# Patient Record
Sex: Female | Born: 1980 | Race: Black or African American | Hispanic: No | Marital: Single | Smoking: Former smoker
Health system: Southern US, Community
[De-identification: ages and names within clinical notes are randomized; demographics above are authoritative.]

## PROBLEM LIST (undated history)

## (undated) DIAGNOSIS — Z8759 Personal history of other complications of pregnancy, childbirth and the puerperium: Secondary | ICD-10-CM

## (undated) DIAGNOSIS — D649 Anemia, unspecified: Secondary | ICD-10-CM

## (undated) DIAGNOSIS — J45909 Unspecified asthma, uncomplicated: Secondary | ICD-10-CM

## (undated) DIAGNOSIS — Z8619 Personal history of other infectious and parasitic diseases: Secondary | ICD-10-CM

## (undated) DIAGNOSIS — Z309 Encounter for contraceptive management, unspecified: Secondary | ICD-10-CM

## (undated) DIAGNOSIS — I2699 Other pulmonary embolism without acute cor pulmonale: Secondary | ICD-10-CM

## (undated) HISTORY — PX: COCCYX FRACTURE SURGERY: SHX599

## (undated) HISTORY — DX: Encounter for contraceptive management, unspecified: Z30.9

## (undated) HISTORY — DX: Other pulmonary embolism without acute cor pulmonale: I26.99

## (undated) HISTORY — DX: Personal history of other infectious and parasitic diseases: Z86.19

## (undated) HISTORY — DX: Unspecified asthma, uncomplicated: J45.909

## (undated) HISTORY — DX: Personal history of other complications of pregnancy, childbirth and the puerperium: Z87.59

## (undated) HISTORY — DX: Anemia, unspecified: D64.9

---

## 2002-07-30 ENCOUNTER — Other Ambulatory Visit: Admission: RE | Admit: 2002-07-30 | Discharge: 2002-07-30 | Payer: Self-pay | Admitting: Obstetrics & Gynecology

## 2006-12-24 ENCOUNTER — Inpatient Hospital Stay (HOSPITAL_COMMUNITY): Admission: AD | Admit: 2006-12-24 | Discharge: 2006-12-27 | Payer: Self-pay | Admitting: *Deleted

## 2006-12-31 ENCOUNTER — Ambulatory Visit: Payer: Self-pay | Admitting: Critical Care Medicine

## 2006-12-31 ENCOUNTER — Inpatient Hospital Stay (HOSPITAL_COMMUNITY): Admission: AD | Admit: 2006-12-31 | Discharge: 2007-01-05 | Payer: Self-pay | Admitting: Obstetrics

## 2009-06-04 IMAGING — CR DG CHEST 2V
3 series · 3 of 3 positions shown · non-contrast
Comparison: 01/01/07.

CLINICAL DATA: Fever.   Short of breath.   Pneumonia.  
 CHEST - 2 VIEWS:

[view not recorded (1 of 3)]
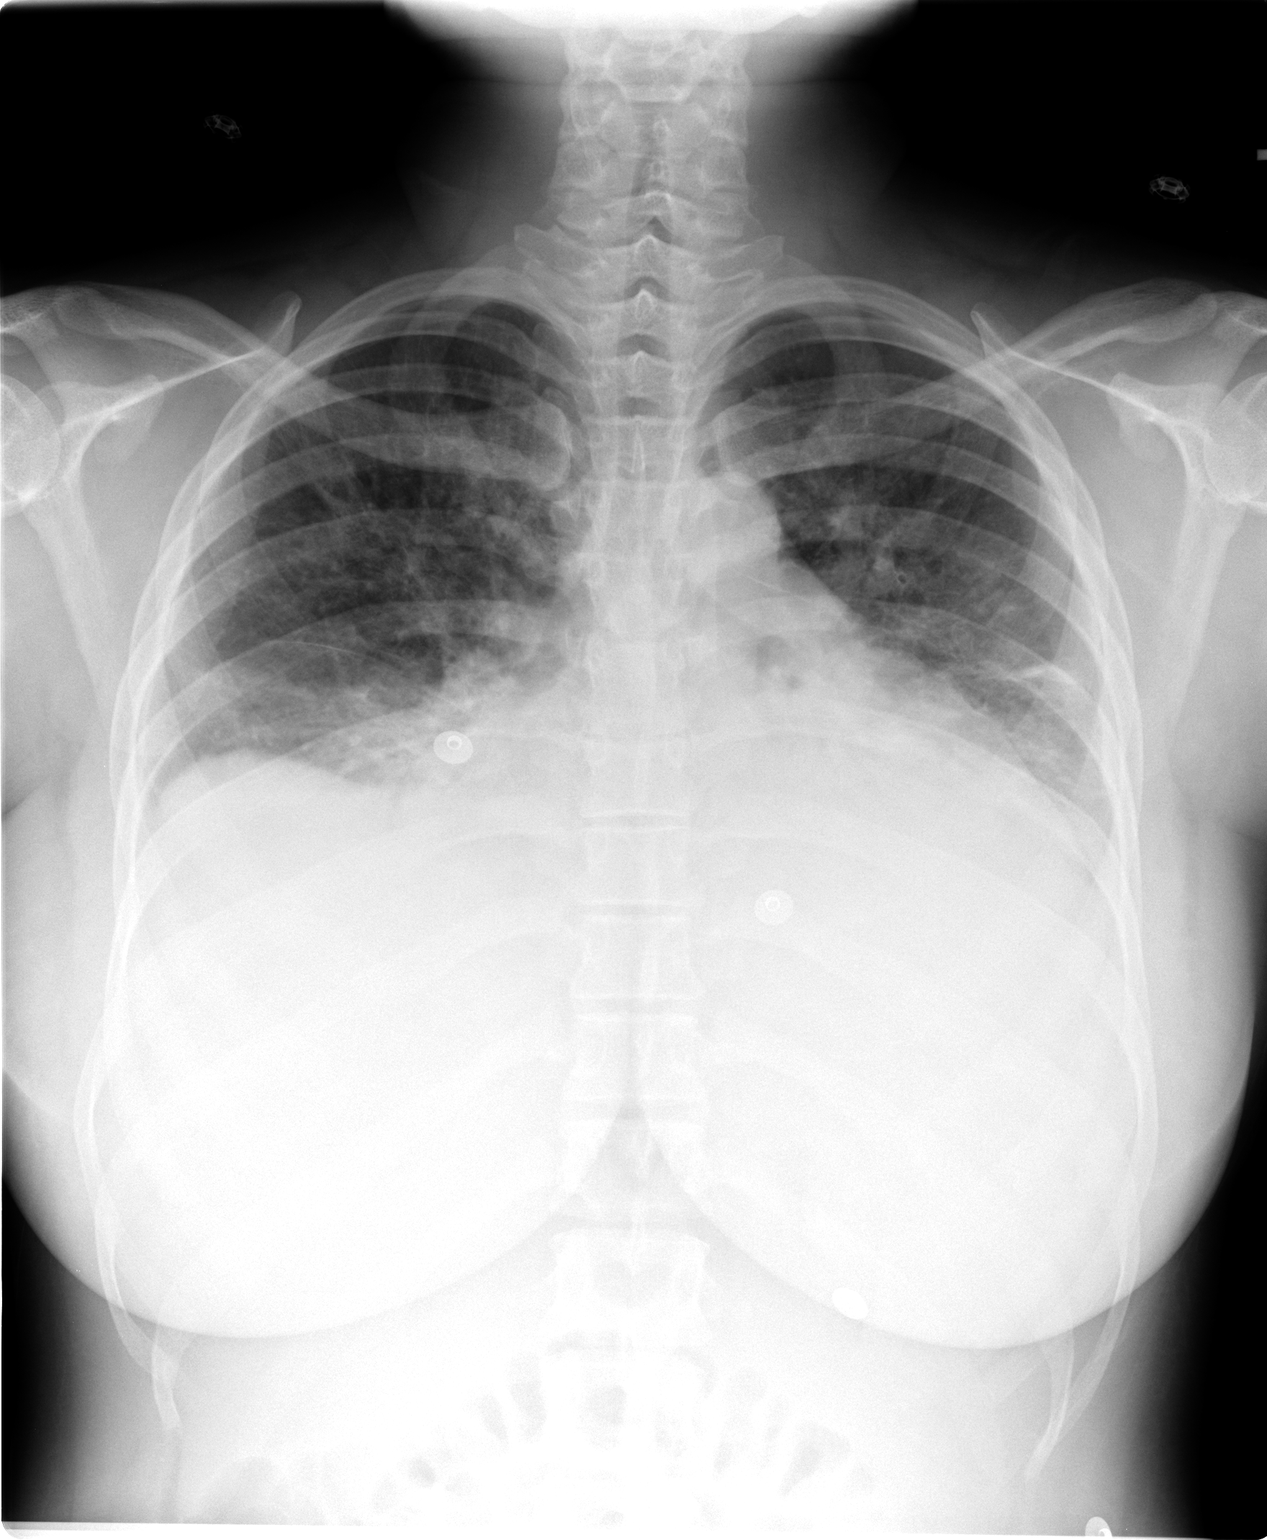

[view not recorded (2 of 3)]
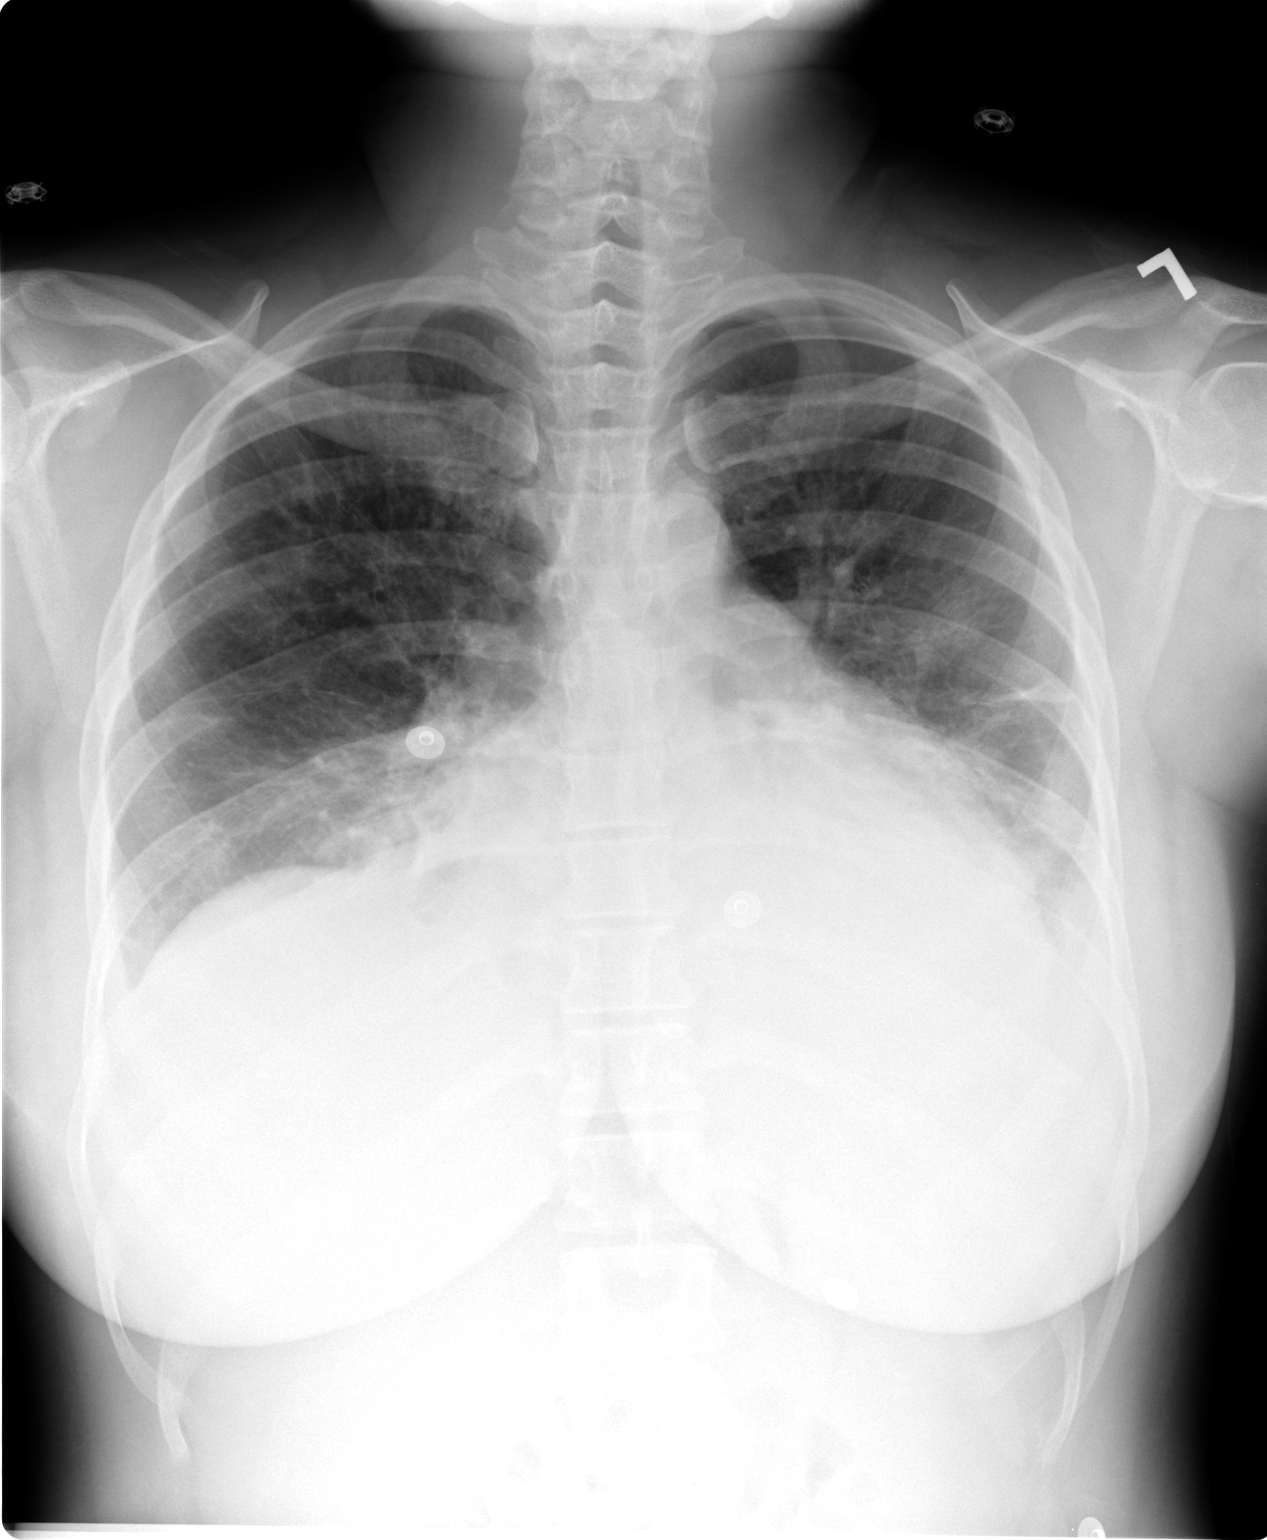

[view not recorded (3 of 3)]
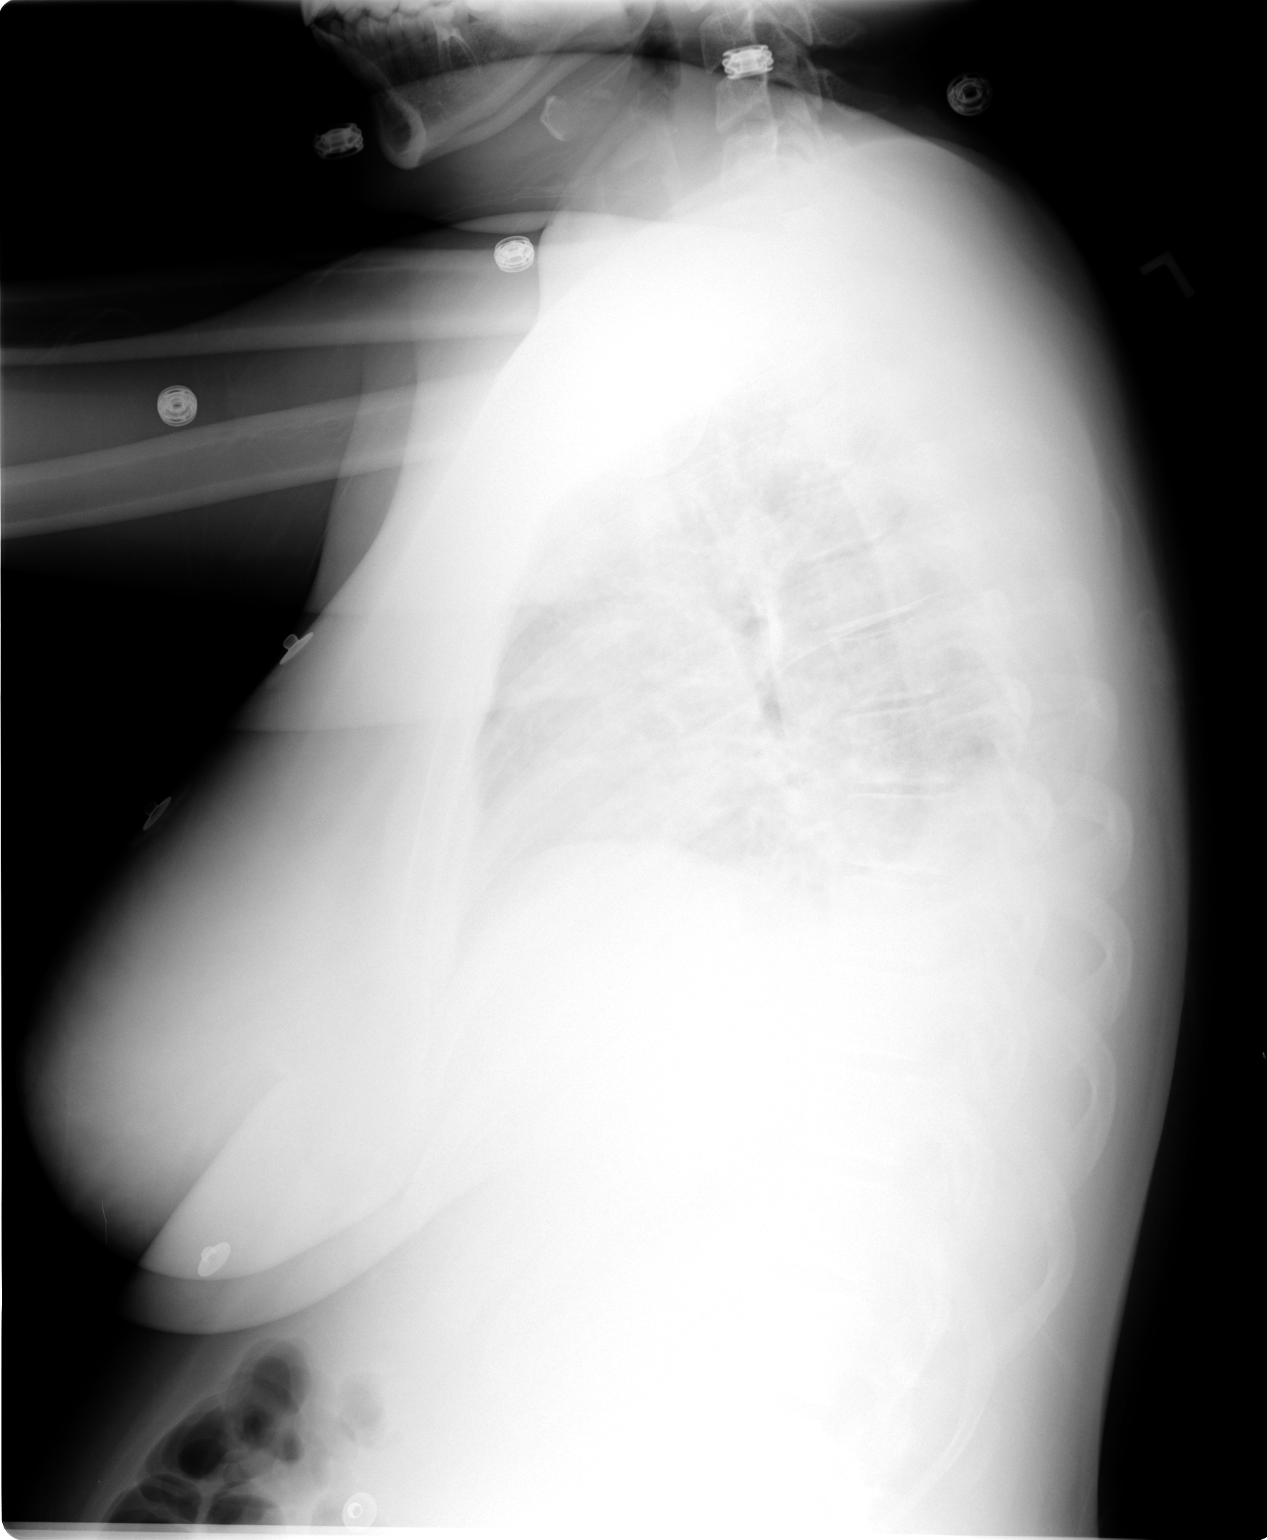

[3 of 3 positions shown; findings below may reference images not displayed]

FINDINGS: The patient is developing pleural effusions.  The cardiac silhouette remains enlarged.  Infiltrates persist in the mid and lower lungs with worsening volume loss at the bases because of the effusions.  No other change.
IMPRESSION: Enlarging effusions.  Persistent infiltrates.  More basilar volume loss because of the effusions.

## 2010-07-03 NOTE — Consult Note (Signed)
NAMEMARVINE, Diana Peterson         ACCOUNT NO.:  [id number]   MEDICAL RECORD NO.:  [id number]          PATIENT TYPE:  INP   LOCATION:  9374                          FACILITY:  [facility]   PHYSICIAN:  Lennie Muckle, MD      DATE OF BIRTH:  1980/10/28   DATE OF CONSULTATION:  01/02/2007  DATE OF DISCHARGE:                                 CONSULTATION   REASON FOR CONSULTATION:  Question of cholecystitis.   HISTORY OF PRESENT ILLNESS:  Diana Peterson is a 30 year old female who  is postpartum since December 25, 2006.  She states on December 30, 2006,  she began having right-sided back pain, as well as white blood cell  count.  She also was noted to have chills, fevers, and a headache, as  well as some mild nausea and vomiting.  She was seen by her gynecologist  and admitted due to a fever of 102 and her shortness of breath.  She  received a chest x-ray, as well as a diagnosis of possible pneumonia.  Was placed on IV antibiotics.  Subsequently, she was found to have  pyelonephritis and a question of cholecystitis based on a CT finding on  CT scan which was performed on December 31, 2006.  There was a lucency  within the gallbladder.  No gallbladder wall thickening or fluid noted.  Followup with an ultrasound showed no evidence of gallstones, but had a  thickened gallbladder wall.  In discussion with the patient today, she  only has some mild right upper quadrant discomfort and aching, but no  severe amount of abdominal pain.  She has no previous episodes of right  upper quadrant pain after eating certain foods and has no history of  problems after eating during her pregnancy.  Her shortness of breath has  become better and her overall fever in the past 24 hours has only been  100.0.  on laboratory data, she has had a mild elevation in her alkaline  phosphatase of 160, previously it was 183.  AST, ALT, and total  bilirubin are normal.   PAST MEDICAL HISTORY:  None.   PAST SURGICAL HISTORY:   She had a pilonidal cyst removed in 2003.   ALLERGIES:  NO DRUG ALLERGIES.   MEDICATIONS:  She takes ibuprofen.   SOCIAL HISTORY:  No drug, tobacco, or alcohol use.   FAMILY HISTORY:  Throat cancer in her maternal uncle.  Diabetes in her  grandfather.  Also history of hypertension and myocardial infarction  within the family.   REVIEW OF SYSTEMS:  Discussed with the patient and upon reviewing the  chart, a 12-point system is negative other than through what is covered  in the HPI.   PHYSICAL EXAMINATION:  GENERAL:  She is a pleasant, young female who  appears her stated age, lying comfortably in bed, in no acute distress.  VITAL SIGNS:  Temperature 100, blood pressure 121/51, pulse 77.  Per the  record, she is on 50% face mask with 97% saturations.  HEENT:  She has some mild scleral erythema on the right.  No scleral  icterus is evident.  CHEST:  Diminished  at the bases on auscultation.  CARDIOVASCULAR:  Regular rate and rhythm.  ABDOMEN:  Protuberant with a minimal amount of pain with palpation in  the right upper quadrant.  No peritoneal signs.  She does have, I  believe, an enlarged uterus, postpartum.   LABORATORY DATA:  On other labs, her white count is 6.9, hemoglobin and  hematocrit 9.2 and 26.1.  BUN and creatinine 5 and 0.8.   ASSESSMENT/PLAN:  Question of acalculous cholecystitis based on  radiographic findings.  Clinically, she only has a mild complaint of  right upper quadrant pain.  Overall, her radiographic findings are  somewhat contradictory.  Clinically, she is not in need for an emergent  cholecystectomy, given her present examination.  She did have  Escherichia coli in her blood cultures.  However, this could be from her  pyelonephritis, despite the fact that her urine culture was negative.  She was on antibiotics prior to obtaining the urine culture.  I think if  she continues to improve on clinical exam, continue with the Cipro for  approximately 10 days  course.  I will plan on seeing her on an  outpatient basis to repeat her ultrasound to see if indeed there is  cholelithiasis.  Her picture is somewhat clouded by the pyelonephritis,  pleural effusions, and question of a pneumonia.  Overall, I think that  clinically she is stable and not critically ill from her gallbladder.  Furthermore, a diagnosis of cholecystitis is not present on clinical  examination. However, if she her symptoms change over the next day or  two, this could indicate a problem with the gallbladder.  But for now, I  think she is stable and in no acute need for a cholecystectomy.  I have  relayed this to Dr. Earlene Plater and we will see [name] again tomorrow  to reevaluate.  This was discussed with her and her significant other  who is present in the room.      Lennie Muckle, MD  Electronically Signed     ALA/MEDQ  D:  01/02/2007  T:  01/03/2007  Job:  [id number]

## 2010-07-06 NOTE — Discharge Summary (Signed)
Diana Peterson, Diana Peterson         ACCOUNT NO.:  [id number]   MEDICAL RECORD NO.:  [id number]          PATIENT TYPE:  INP   LOCATION:  9317                          FACILITY:  [facility]   PHYSICIAN:  Dormont B. Earlene Plater, M.D.  DATE OF BIRTH:  1980/02/27   DATE OF ADMISSION:  12/31/2006  DATE OF DISCHARGE:  01/05/2007                               DISCHARGE SUMMARY   ADMISSION DIAGNOSIS:  Postpartum fever.   DISCHARGE DIAGNOSES:  Pyelonephritis, sepsis, and cholecystitis.   HISTORY OF PRESENT ILLNESS:  A 30 year old African-American female,  approximately 1 week postpartum, presented with fever of 102, shaking  chills, back pain, shortness of breath.   HOSPITAL COURSE:  The patient was admitted to the ICU, admitted to the  third floor GYN, initially.  She was found to have diffuse crackles  posterior aspect of lung fields bilaterally.  Chest x-ray suggestive of  pneumonia.  White count within normal limits.  The patient was started  on empiric treatment for community-acquired pneumonia, when she began to  have desaturations into the 70s to 80s percent range, as well as well a  fever of 102 with associated tachypnea.  She was transferred to the ICU,  critical care consult obtained.  The CT abdomen and pelvis, pain  consistent with pyelonephritis and suggestion of cholecystitis.  The  patient's antibiotics were broadened to cover pyelonephritis, sepsis,  and the patient rapidly improved clinically.  A general surgery  consultation was obtained, and it was the opinion of the surgeon that  the cholecystitis was probably secondary to sepsis and was acalculous.  Plan for oral antibiotics and follow-up as an outpatient.  The patient's  urine culture ultimately did grow E-coli, which was sensitive to  antibiotics prescribed.  She defervesced, did require Lasix to remove  pulmonary interstitial fluid.  Urinary antigens for pneumococcus and  Legionella were negative.  Blood cultures were positive for  E-coli.  By  day of discharge, the patient had been afebrile, stable vital signs,  normal oxygen saturation, feeling much better.  She was discharged home  on 2-week course of Cipro 500 mg p.o. b.i.d., follow up with a week in  the office, and plan was to follow-up with surgery as an outpatient.   DISCHARGE MEDICATIONS:  1. Cipro 500 mg p.o. b.i.d.  2. Zebeta 5 mg b.i.d.   DISCHARGE INSTRUCTIONS:  Call for fever, pain, or worsening symptoms.  Followup one week.      Gerri Spore B. Earlene Plater, M.D.  Electronically Signed     WBD/MEDQ  D:  02/15/2007  T:  02/15/2007  Job:  [id number]

## 2010-07-27 ENCOUNTER — Emergency Department (HOSPITAL_COMMUNITY)
Admission: EM | Admit: 2010-07-27 | Discharge: 2010-07-27 | Disposition: A | Discharge status: Home / Self Care | Payer: Self-pay | Attending: Emergency Medicine | Admitting: Emergency Medicine

## 2010-07-27 DIAGNOSIS — J45909 Unspecified asthma, uncomplicated: Secondary | ICD-10-CM | POA: Insufficient documentation

## 2010-07-27 DIAGNOSIS — L732 Hidradenitis suppurativa: Secondary | ICD-10-CM | POA: Insufficient documentation

## 2010-11-27 LAB — DIFFERENTIAL
Basophils Absolute: 0
Basophils Absolute: 0
Basophils Absolute: 0
Basophils Relative: 0
Basophils Relative: 0
Basophils Relative: 0
Eosinophils Absolute: 0 — ABNORMAL LOW
Eosinophils Relative: 0
Lymphocytes Relative: 10 — ABNORMAL LOW
Lymphocytes Relative: 14
Lymphocytes Relative: 6 — ABNORMAL LOW
Lymphs Abs: 1.1
Monocytes Absolute: 0.8
Monocytes Absolute: 1.1 — ABNORMAL HIGH
Monocytes Relative: 10
Monocytes Relative: 9
Neutro Abs: 5.1
Neutro Abs: 8.2 — ABNORMAL HIGH
Neutro Abs: 8.6 — ABNORMAL HIGH
Neutrophils Relative %: 74
Neutrophils Relative %: 86 — ABNORMAL HIGH

## 2010-11-27 LAB — BLOOD GAS, ARTERIAL
Acid-Base Excess: 0.3
Bicarbonate: 21.9
Drawn by: 148
O2 Saturation: 98
TCO2: 22.9
pCO2 arterial: 30.7 — ABNORMAL LOW
pH, Arterial: 7.468 — ABNORMAL HIGH
pO2, Arterial: 97

## 2010-11-27 LAB — CBC
HCT: 23.5 — ABNORMAL LOW
HCT: 24.7 — ABNORMAL LOW
HCT: 26.5 — ABNORMAL LOW
HCT: 29.3 — ABNORMAL LOW
HCT: 32.8 — ABNORMAL LOW
HCT: 34.4 — ABNORMAL LOW
Hemoglobin: 11.5 — ABNORMAL LOW
Hemoglobin: 8.5 — ABNORMAL LOW
Hemoglobin: 8.8 — ABNORMAL LOW
Hemoglobin: 8.8 — ABNORMAL LOW
MCHC: 34.5
MCHC: 34.5
MCHC: 34.8
MCHC: 35.2
MCV: 91
MCV: 91.4
MCV: 91.9
MCV: 92.3
MCV: 92.5
Platelets: 159
Platelets: 179
Platelets: 215
Platelets: 242
Platelets: 301
RBC: 2.72 — ABNORMAL LOW
RBC: 2.73 — ABNORMAL LOW
RBC: 2.78 — ABNORMAL LOW
RBC: 3.51 — ABNORMAL LOW
RDW: 14.2 — ABNORMAL HIGH
RDW: 14.3 — ABNORMAL HIGH
RDW: 14.7
RDW: 14.9 — ABNORMAL HIGH
RDW: 15
RDW: 15.1
RDW: 15.3
WBC: 12 — ABNORMAL HIGH
WBC: 17.3 — ABNORMAL HIGH

## 2010-11-27 LAB — COMPREHENSIVE METABOLIC PANEL
ALT: 17
ALT: 17
ALT: 24
AST: 29
AST: 31
AST: 40 — ABNORMAL HIGH
Albumin: 1.8 — ABNORMAL LOW
Albumin: 1.9 — ABNORMAL LOW
Alkaline Phosphatase: 103
Alkaline Phosphatase: 137 — ABNORMAL HIGH
Alkaline Phosphatase: 144 — ABNORMAL HIGH
Alkaline Phosphatase: 146 — ABNORMAL HIGH
Alkaline Phosphatase: 160 — ABNORMAL HIGH
Alkaline Phosphatase: 183 — ABNORMAL HIGH
BUN: 3 — ABNORMAL LOW
BUN: 4 — ABNORMAL LOW
BUN: 5 — ABNORMAL LOW
BUN: 7
BUN: 7
CO2: 24
CO2: 24
CO2: 25
CO2: 26
Calcium: 7.2 — ABNORMAL LOW
Calcium: 7.4 — ABNORMAL LOW
Calcium: 8.9
Chloride: 100
Chloride: 102
Creatinine, Ser: 0.69
Creatinine, Ser: 0.82
Creatinine, Ser: 1.12
GFR calc Af Amer: >60
GFR calc Af Amer: >60
GFR calc Af Amer: >60
GFR calc non Af Amer: 58 — ABNORMAL LOW
GFR calc non Af Amer: >60
GFR calc non Af Amer: >60
Glucose, Bld: 166 — ABNORMAL HIGH
Glucose, Bld: 73
Glucose, Bld: 88
Potassium: 3.1 — ABNORMAL LOW
Potassium: 3.1 — ABNORMAL LOW
Potassium: 3.2 — ABNORMAL LOW
Potassium: 3.2 — ABNORMAL LOW
Potassium: 3.3 — ABNORMAL LOW
Potassium: 4
Sodium: 130 — ABNORMAL LOW
Sodium: 135
Sodium: 138
Sodium: 138
Total Bilirubin: 0.4
Total Protein: 5.3 — ABNORMAL LOW
Total Protein: 5.4 — ABNORMAL LOW
Total Protein: 5.4 — ABNORMAL LOW

## 2010-11-27 LAB — CULTURE, BLOOD (ROUTINE X 2)

## 2010-11-27 LAB — URINE CULTURE: Colony Count: NO GROWTH

## 2010-11-27 LAB — URINE MICROSCOPIC-ADD ON

## 2010-11-27 LAB — BASIC METABOLIC PANEL
Calcium: 7.6 — ABNORMAL LOW
Creatinine, Ser: 0.86
GFR calc Af Amer: >60
GFR calc non Af Amer: >60
Sodium: 137

## 2010-11-27 LAB — CALCIUM, IONIZED

## 2010-11-27 LAB — MAGNESIUM
Magnesium: 1.6
Magnesium: 1.9

## 2010-11-27 LAB — PHOSPHORUS
Phosphorus: 3.3
Phosphorus: 3.5

## 2010-11-27 LAB — URINALYSIS, ROUTINE W REFLEX MICROSCOPIC
Glucose, UA: NEGATIVE
Ketones, ur: NEGATIVE
Protein, ur: NEGATIVE

## 2010-11-27 LAB — LEGIONELLA ANTIGEN, URINE

## 2010-11-27 LAB — LACTATE DEHYDROGENASE: LDH: 505 — ABNORMAL HIGH

## 2010-11-27 LAB — B-NATRIURETIC PEPTIDE (CONVERTED LAB): Pro B Natriuretic peptide (BNP): 286 — ABNORMAL HIGH

## 2010-11-27 LAB — STREP PNEUMONIAE URINARY ANTIGEN: Strep Pneumo Urinary Antigen: NEGATIVE

## 2010-11-27 LAB — URIC ACID
Uric Acid, Serum: 6.5
Uric Acid, Serum: 6.9

## 2010-11-27 LAB — LACTIC ACID, PLASMA: Lactic Acid, Venous: 2

## 2010-11-27 LAB — SEDIMENTATION RATE: Sed Rate: 107 — ABNORMAL HIGH

## 2010-11-27 LAB — RPR: RPR Ser Ql: NONREACTIVE

## 2014-01-10 LAB — OB RESULTS CONSOLE ANTIBODY SCREEN: ANTIBODY SCREEN: NEGATIVE

## 2014-01-10 LAB — OB RESULTS CONSOLE HEPATITIS B SURFACE ANTIGEN: HEP B S AG: NEGATIVE

## 2014-01-10 LAB — OB RESULTS CONSOLE GC/CHLAMYDIA
CHLAMYDIA, DNA PROBE: NEGATIVE
Gonorrhea: NEGATIVE

## 2014-01-10 LAB — OB RESULTS CONSOLE RUBELLA ANTIBODY, IGM: Rubella: IMMUNE

## 2014-01-10 LAB — OB RESULTS CONSOLE RPR: RPR: NONREACTIVE

## 2014-01-10 LAB — OB RESULTS CONSOLE ABO/RH: RH Type: POSITIVE

## 2014-01-10 LAB — OB RESULTS CONSOLE HIV ANTIBODY (ROUTINE TESTING): HIV: NONREACTIVE

## 2014-02-18 NOTE — L&D Delivery Note (Addendum)
0300: Nurse call reports patient with SROM and has been laboring down at 9.5cm since 0215.  In room to assess and patient on side c/o rectal pressure and bowel movement. Visual exam notes infant at +4 station.  FHR reassuring.  Provider gowned for delivery. Patient instructed to push and delivered as below with staff and FOB support.   Delivery Note At 3:12 AM, on August 13, 2014, a viable female "Heron Sabins" was delivered via Vaginal, Spontaneous Delivery (Presentation: Left Occiput Anterior with restitution to LOT). Shoulders delivered easily and infant with good tone and spontaneous cry. Infant placed on mother's abdomen where nurse provided tactile stimulation.  Infant APGAR: 9, 9. Cord clamped, cut, and blood collected. Placenta delivered spontaneously and noted to be intact with 3VC upon inspection.  Vaginal inspection revealed a small right labial lacerations. Repair was made without the need for additional anesthetic. Fundus firm, at the umbilicus, and bleeding small.  Mother hemodynamically stable and infant skin to skin, with father, prior to provider exit.  Mother desires BTL for birth control method and opts to breastfeed.  Infant weight at one hour of life: 6lbs 14.2oz  Anesthesia: Epidural  Episiotomy: None Lacerations: Labial Suture Repair: 3.0 vicryl on SH Est. Blood Loss (mL): 100  Mom to postpartum.  Baby to Couplet care / Skin to Skin.  Ara Mano LYNN MSN, CNM 08/13/2014, 3:57 AM

## 2014-07-19 LAB — OB RESULTS CONSOLE GBS: STREP GROUP B AG: POSITIVE

## 2014-08-08 ENCOUNTER — Encounter (HOSPITAL_COMMUNITY): Payer: Self-pay | Admitting: *Deleted

## 2014-08-08 ENCOUNTER — Telehealth (HOSPITAL_COMMUNITY): Payer: Self-pay | Admitting: *Deleted

## 2014-08-08 NOTE — Telephone Encounter (Signed)
Preadmission screen  

## 2014-08-12 ENCOUNTER — Inpatient Hospital Stay (HOSPITAL_COMMUNITY)
Admission: AD | Admit: 2014-08-12 | Discharge: 2014-08-15 | DRG: 775 | Disposition: A | Discharge status: Home / Self Care | Payer: Medicaid Other | Source: Ambulatory Visit | Attending: Obstetrics and Gynecology | Admitting: Obstetrics and Gynecology

## 2014-08-12 ENCOUNTER — Encounter (HOSPITAL_COMMUNITY): Payer: Self-pay | Admitting: *Deleted

## 2014-08-12 DIAGNOSIS — O358XX0 Maternal care for other (suspected) fetal abnormality and damage, not applicable or unspecified: Secondary | ICD-10-CM | POA: Diagnosis present

## 2014-08-12 DIAGNOSIS — O99824 Streptococcus B carrier state complicating childbirth: Secondary | ICD-10-CM | POA: Diagnosis present

## 2014-08-12 DIAGNOSIS — Z3A39 39 weeks gestation of pregnancy: Secondary | ICD-10-CM | POA: Diagnosis present

## 2014-08-12 DIAGNOSIS — O9902 Anemia complicating childbirth: Secondary | ICD-10-CM | POA: Diagnosis present

## 2014-08-12 DIAGNOSIS — J45909 Unspecified asthma, uncomplicated: Secondary | ICD-10-CM | POA: Diagnosis present

## 2014-08-12 DIAGNOSIS — Z23 Encounter for immunization: Secondary | ICD-10-CM

## 2014-08-12 DIAGNOSIS — Z87891 Personal history of nicotine dependence: Secondary | ICD-10-CM | POA: Diagnosis not present

## 2014-08-12 DIAGNOSIS — O9952 Diseases of the respiratory system complicating childbirth: Secondary | ICD-10-CM | POA: Diagnosis present

## 2014-08-12 DIAGNOSIS — N858 Other specified noninflammatory disorders of uterus: Secondary | ICD-10-CM | POA: Diagnosis present

## 2014-08-12 DIAGNOSIS — IMO0001 Reserved for inherently not codable concepts without codable children: Secondary | ICD-10-CM

## 2014-08-12 LAB — CBC
HCT: 33.2 % — ABNORMAL LOW (ref 36.0–46.0)
Hemoglobin: 11.7 g/dL — ABNORMAL LOW (ref 12.0–15.0)
MCH: 31.2 pg (ref 26.0–34.0)
MCHC: 35.2 g/dL (ref 30.0–36.0)
MCV: 88.5 fL (ref 78.0–100.0)
Platelets: 190 10*3/uL (ref 150–400)
RBC: 3.75 MIL/uL — ABNORMAL LOW (ref 3.87–5.11)
RDW: 14.4 % (ref 11.5–15.5)
WBC: 11.6 10*3/uL — ABNORMAL HIGH (ref 4.0–10.5)

## 2014-08-12 MED ORDER — OXYTOCIN 40 UNITS IN LACTATED RINGERS INFUSION - SIMPLE MED
INTRAVENOUS | Status: AC
  Filled 2014-08-12: qty 1000

## 2014-08-12 MED ORDER — LIDOCAINE HCL (PF) 1 % IJ SOLN
INTRAMUSCULAR | Status: AC
  Administered 2014-08-13 (×2): 5 mL
  Administered 2014-08-13: 3 mL
  Filled 2014-08-12: qty 30

## 2014-08-12 MED ORDER — FENTANYL 2.5 MCG/ML BUPIVACAINE 1/10 % EPIDURAL INFUSION (WH - ANES)
14.0000 mL/h | INTRAMUSCULAR | Status: DC | PRN
  Administered 2014-08-13 (×2): 14 mL/h via EPIDURAL

## 2014-08-12 MED ORDER — PHENYLEPHRINE 40 MCG/ML (10ML) SYRINGE FOR IV PUSH (FOR BLOOD PRESSURE SUPPORT)
80.0000 ug | PREFILLED_SYRINGE | INTRAVENOUS | Status: DC | PRN
  Filled 2014-08-12: qty 2

## 2014-08-12 MED ORDER — EPHEDRINE 5 MG/ML INJ
10.0000 mg | INTRAVENOUS | Status: DC | PRN
  Filled 2014-08-12: qty 2

## 2014-08-12 MED ORDER — PHENYLEPHRINE 40 MCG/ML (10ML) SYRINGE FOR IV PUSH (FOR BLOOD PRESSURE SUPPORT)
PREFILLED_SYRINGE | INTRAVENOUS | Status: AC
  Filled 2014-08-12: qty 20

## 2014-08-12 MED ORDER — DIPHENHYDRAMINE HCL 50 MG/ML IJ SOLN: 12.5000 mg | INTRAMUSCULAR | Status: DC | PRN

## 2014-08-12 MED ORDER — LACTATED RINGERS IV SOLN
INTRAVENOUS | Status: DC
  Administered 2014-08-12: 23:00:00 via INTRAVENOUS

## 2014-08-12 MED ORDER — FENTANYL 2.5 MCG/ML BUPIVACAINE 1/10 % EPIDURAL INFUSION (WH - ANES)
INTRAMUSCULAR | Status: AC
  Filled 2014-08-12: qty 125

## 2014-08-12 NOTE — H&P (Signed)
Diana Peterson is a 34 y.o. female, G4P1011 at 39.4 weeks, presenting for contractions.  Patient reports contractions started around 5pm and have slowly increased in frequency and intensity.  Patient initially registered under faculty practice and allowed to ambulate with cervical change from 2cm to 4cm.  Patient admitted for active labor.  Reports desire for epidural.  GBS Positive  Patient Active Problem List   Diagnosis Date Noted  . Active labor at term 08/13/2014    History of present pregnancy: Patient entered care at 10.3wks, but moved to [place].  Patient transferred back to [facility] at 35wks [facility] of 08/15/2014 was established by 10.3wk [place] on 01/20/2014.   Anatomy scan:  19.4 weeks, with normal findings and an unknown placenta.   Additional [place] evaluations:  -36.1wks: Growth U/S 5lbs 7oz, nl fluid, EFW 15%, vtx, post placenta, BPD and head lagging. .   -38 wks: Growth U/S BPP 8/8, cephalic, nl fluid, EFW 6lbs 5oz 19%. -39.1wks: BPP 8/8. Significant prenatal events:  Patient moved from [place] to [place] and then back to [place].  Patient with edema, but no other complaints.  Fetal BPD and head lagging noted.  Patient was scheduled for IOL secondary to lag.   Last evaluation:  08/09/2014   39 wks 1 days  Dr.E. Sallye Ober  FHR: 150  SVE: 1cm / 10% / -3 BPL 120/80  Wt: 184 lbs  OB History    Gravida Para Term Preterm AB TAB SAB Ectopic Multiple Living   Past Medical History  Diagnosis Date  . Anemia   . Hx of chlamydia infection   . Hx of varicella   . History of gestational hypertension   . Asthma    Past Surgical History  Procedure Laterality Date  . Coccyx fracture surgery      2002 and 2003   Family History: family history includes Diabetes in her paternal aunt, paternal grandmother, and paternal uncle; Hyperlipidemia in her father; Hypertension in her father. There is no history of Birth defects, Asthma, Arthritis, Alcohol abuse, Cancer, COPD, Depression, Drug abuse, Early  death, Hearing loss, Heart disease, Kidney disease, Learning disabilities, Mental illness, Mental retardation, Miscarriages / Stillbirths, Stroke, Vision loss, or Varicose Veins. Social History:  reports that she quit smoking about 9 months ago. She has never used smokeless tobacco. She reports that she does not drink alcohol or use illicit drugs.   Prenatal Transfer Tool  Maternal Diabetes: No Genetic Screening: Normal Maternal Ultrasounds/Referrals: Normal Fetal Ultrasounds or other Referrals:  None Maternal Substance Abuse:  No Significant Maternal Medications:  None Significant Maternal Lab Results: Lab values include: Group B Strep positive    ROS:  +LOF, -VB, +FM, +CTXX  No Known Allergies   Dilation: 4 Effacement (%): 70 Station: -3 Exam by:: Leafy Ro, RNC Blood pressure 132/81, pulse 100, temperature 98.2 F (36.8 C), resp. rate 18, height  (1.575 m), weight 83.008 kg (183 lb), last menstrual period 10/04/2013.  Physical Exam  Constitutional: She is oriented to person, place, and time. She appears well-developed and well-nourished.  HENT:  Head: Normocephalic and atraumatic.  Eyes: EOM are normal.  Neck: Normal range of motion.  Cardiovascular: Normal rate, regular rhythm and normal heart sounds.   Respiratory: Effort normal and breath sounds normal.  GI: Soft. Bowel sounds are normal.  Musculoskeletal: Normal range of motion. She exhibits no edema.  Neurological: She is alert and oriented to person, place,  and time.  Skin: Skin is warm and dry.     FHR: 135 bpm, Mod Var, -Decels, +Accels  UCs:  q2-65min, palpates moderate  Prenatal labs: ABO, Rh: O/Positive/-- (11/23 0000) Antibody: Negative (11/23 0000) Rubella:    Immune RPR: Nonreactive (11/23 0000)  HBsAg: Negative (11/23 0000)  HIV: Non-reactive (11/23 0000)  GBS: Positive (05/31 0000) Sickle cell/Hgb electrophoresis:  Unknown Pap:  Normal 09/2012 GC:  Negative Chlamydia:  Negative Other:   Panaroma Low Risk    Assessment IUP at 39.4wks Cat I FT Active Labor GBS Positive BPD and Head Lagging   Plan: Admit to [employer] Routine Labor and Delivery Orders per [facility] Protocol In room to complete assessment and discuss POC: -Okay for epidural -Will receive PCN for GBS prophylaxis Dr. AVS to be updated as appropriate  Phillips Climes, MSN 08/12/2014, 10:59 PM

## 2014-08-12 NOTE — MAU Note (Signed)
Contractions since 1700 and consistent and getting closer. Denies LOF and bleeding

## 2014-08-12 NOTE — MAU Note (Signed)
Report called to Prudence Davidson, BS charge RN.  Patient to be admitted to room 170.

## 2014-08-13 ENCOUNTER — Inpatient Hospital Stay (HOSPITAL_COMMUNITY): Payer: Medicaid Other | Admitting: Anesthesiology

## 2014-08-13 ENCOUNTER — Encounter (HOSPITAL_COMMUNITY): Payer: Self-pay

## 2014-08-13 DIAGNOSIS — IMO0001 Reserved for inherently not codable concepts without codable children: Secondary | ICD-10-CM

## 2014-08-13 LAB — ABO/RH: ABO/RH(D): O POS

## 2014-08-13 LAB — TYPE AND SCREEN
ABO/RH(D): O POS
Antibody Screen: NEGATIVE

## 2014-08-13 LAB — RPR: RPR: NONREACTIVE

## 2014-08-13 MED ORDER — ONDANSETRON HCL 4 MG/2ML IJ SOLN: 4.0000 mg | Freq: Four times a day (QID) | INTRAMUSCULAR | Status: DC | PRN

## 2014-08-13 MED ORDER — WITCH HAZEL-GLYCERIN EX PADS
1.0000 "application " | MEDICATED_PAD | CUTANEOUS | Status: DC | PRN
  Administered 2014-08-13: 1 via TOPICAL

## 2014-08-13 MED ORDER — OXYCODONE-ACETAMINOPHEN 5-325 MG PO TABS: 2.0000 | ORAL_TABLET | ORAL | Status: DC | PRN

## 2014-08-13 MED ORDER — OXYCODONE-ACETAMINOPHEN 5-325 MG PO TABS: 1.0000 | ORAL_TABLET | ORAL | Status: DC | PRN

## 2014-08-13 MED ORDER — SENNOSIDES-DOCUSATE SODIUM 8.6-50 MG PO TABS
2.0000 | ORAL_TABLET | ORAL | Status: DC
  Administered 2014-08-13 – 2014-08-14 (×2): 2 via ORAL
  Filled 2014-08-13 (×2): qty 2

## 2014-08-13 MED ORDER — PENICILLIN G POTASSIUM 5000000 UNITS IJ SOLR
2.5000 10*6.[IU] | INTRAVENOUS | Status: DC
  Filled 2014-08-13 (×4): qty 2.5

## 2014-08-13 MED ORDER — DIBUCAINE 1 % RE OINT
1.0000 "application " | TOPICAL_OINTMENT | RECTAL | Status: DC | PRN
  Administered 2014-08-13: 1 via RECTAL
  Filled 2014-08-13: qty 28

## 2014-08-13 MED ORDER — OXYTOCIN BOLUS FROM INFUSION
500.0000 mL | INTRAVENOUS | Status: DC
  Administered 2014-08-13: 500 mL via INTRAVENOUS

## 2014-08-13 MED ORDER — SIMETHICONE 80 MG PO CHEW: 80.0000 mg | CHEWABLE_TABLET | ORAL | Status: DC | PRN

## 2014-08-13 MED ORDER — PENICILLIN G POTASSIUM 5000000 UNITS IJ SOLR
5.0000 10*6.[IU] | Freq: Once | INTRAVENOUS | Status: AC
  Administered 2014-08-13: 5 10*6.[IU] via INTRAVENOUS
  Filled 2014-08-13: qty 5

## 2014-08-13 MED ORDER — ONDANSETRON HCL 4 MG/2ML IJ SOLN: 4.0000 mg | INTRAMUSCULAR | Status: DC | PRN

## 2014-08-13 MED ORDER — ONDANSETRON HCL 4 MG PO TABS: 4.0000 mg | ORAL_TABLET | ORAL | Status: DC | PRN

## 2014-08-13 MED ORDER — DIPHENHYDRAMINE HCL 25 MG PO CAPS: 25.0000 mg | ORAL_CAPSULE | Freq: Four times a day (QID) | ORAL | Status: DC | PRN

## 2014-08-13 MED ORDER — LIDOCAINE HCL (PF) 1 % IJ SOLN
30.0000 mL | INTRAMUSCULAR | Status: DC | PRN
  Filled 2014-08-13: qty 30

## 2014-08-13 MED ORDER — PRENATAL MULTIVITAMIN CH
1.0000 | ORAL_TABLET | Freq: Every day | ORAL | Status: DC
  Administered 2014-08-13 – 2014-08-15 (×3): 1 via ORAL
  Filled 2014-08-13 (×3): qty 1

## 2014-08-13 MED ORDER — ACETAMINOPHEN 325 MG PO TABS: 650.0000 mg | ORAL_TABLET | ORAL | Status: DC | PRN

## 2014-08-13 MED ORDER — BENZOCAINE-MENTHOL 20-0.5 % EX AERO
1.0000 "application " | INHALATION_SPRAY | CUTANEOUS | Status: DC | PRN
  Administered 2014-08-13: 1 via TOPICAL
  Filled 2014-08-13: qty 56

## 2014-08-13 MED ORDER — ZOLPIDEM TARTRATE 5 MG PO TABS: 5.0000 mg | ORAL_TABLET | Freq: Every evening | ORAL | Status: DC | PRN

## 2014-08-13 MED ORDER — TETANUS-DIPHTH-ACELL PERTUSSIS 5-2.5-18.5 LF-MCG/0.5 IM SUSP
0.5000 mL | Freq: Once | INTRAMUSCULAR | Status: AC
  Administered 2014-08-13: 0.5 mL via INTRAMUSCULAR

## 2014-08-13 MED ORDER — OXYTOCIN 40 UNITS IN LACTATED RINGERS INFUSION - SIMPLE MED: 62.5000 mL/h | INTRAVENOUS | Status: DC

## 2014-08-13 MED ORDER — LANOLIN HYDROUS EX OINT: TOPICAL_OINTMENT | CUTANEOUS | Status: DC | PRN

## 2014-08-13 MED ORDER — CITRIC ACID-SODIUM CITRATE 334-500 MG/5ML PO SOLN: 30.0000 mL | ORAL | Status: DC | PRN

## 2014-08-13 MED ORDER — LACTATED RINGERS IV SOLN
500.0000 mL | INTRAVENOUS | Status: DC | PRN
Start: 2014-08-13 — End: 2014-08-13

## 2014-08-13 MED ORDER — FENTANYL CITRATE (PF) 100 MCG/2ML IJ SOLN: 50.0000 ug | INTRAMUSCULAR | Status: DC | PRN

## 2014-08-13 MED ORDER — IBUPROFEN 600 MG PO TABS
600.0000 mg | ORAL_TABLET | Freq: Four times a day (QID) | ORAL | Status: DC
  Administered 2014-08-13 – 2014-08-15 (×10): 600 mg via ORAL
  Filled 2014-08-13 (×10): qty 1

## 2014-08-13 NOTE — Anesthesia Postprocedure Evaluation (Signed)
Anesthesia Post Note  Patient: Diana Peterson  Procedure(s) Performed: * No procedures listed *  Anesthesia type: Epidural  Patient location: Mother/Baby  Post pain: Pain level controlled  Post assessment: Post-op Vital signs reviewed  Last Vitals:  Filed Vitals:   08/13/14 1100  BP: 114/60  Pulse: 85  Temp: 36.7 C  Resp: 19    Post vital signs: Reviewed  Level of consciousness: awake  Complications: No apparent anesthesia complications

## 2014-08-13 NOTE — Anesthesia Preprocedure Evaluation (Signed)
Anesthesia Evaluation  Patient identified by MRN, date of birth, ID band Patient awake    Reviewed: Allergy & Precautions, H&P , NPO status , Patient's Chart, lab work & pertinent test results  History of Anesthesia Complications Negative for: history of anesthetic complications  Airway Mallampati: II  TM Distance: >3 FB Neck ROM: full    Dental no notable dental hx. (+) Teeth Intact   Pulmonary neg pulmonary ROS, former smoker,  breath sounds clear to auscultation  Pulmonary exam normal       Cardiovascular negative cardio ROS Normal cardiovascular examRhythm:regular Rate:Normal     Neuro/Psych negative neurological ROS  negative psych ROS   GI/Hepatic negative GI ROS, Neg liver ROS,   Endo/Other  negative endocrine ROS  Renal/GU negative Renal ROS  negative genitourinary   Musculoskeletal   Abdominal   Peds  Hematology negative hematology ROS (+)   Anesthesia Other Findings   Reproductive/Obstetrics (+) Pregnancy                             Anesthesia Physical Anesthesia Plan  ASA: II  Anesthesia Plan: Epidural   Post-op Pain Management:    Induction:   Airway Management Planned:   Additional Equipment:   Intra-op Plan:   Post-operative Plan:   Informed Consent: I have reviewed the patients History and Physical, chart, labs and discussed the procedure including the risks, benefits and alternatives for the proposed anesthesia with the patient or authorized representative who has indicated his/her understanding and acceptance.     Plan Discussed with:   Anesthesia Plan Comments:         Anesthesia Quick Evaluation  

## 2014-08-13 NOTE — Anesthesia Procedure Notes (Signed)
Epidural Patient location during procedure: OB  Staffing Anesthesiologist: Tiyah Zelenak Performed by: anesthesiologist   Preanesthetic Checklist Completed: patient identified, site marked, surgical consent, pre-op evaluation, timeout performed, IV checked, risks and benefits discussed and monitors and equipment checked  Epidural Patient position: sitting Prep: DuraPrep Patient monitoring: heart rate, continuous pulse ox and blood pressure Approach: midline Location: L3-L4 Injection technique: LOR saline  Needle:  Needle type: Tuohy  Needle gauge: 17 G Needle length: 9 cm and 9 Needle insertion depth: 6 cm Catheter type: closed end flexible Catheter size: 20 Guage Catheter at skin depth: 10 cm Test dose: negative  Assessment Events: blood not aspirated, injection not painful, no injection resistance, negative IV test and no paresthesia  Additional Notes Patient identified. Risks/Benefits/Options discussed with patient including but not limited to bleeding, infection, nerve damage, paralysis, failed block, incomplete pain control, headache, blood pressure changes, nausea, vomiting, reactions to medication both or allergic, itching and postpartum back pain. Confirmed with bedside nurse the patient's most recent platelet count. Confirmed with patient that they are not currently taking any anticoagulation, have any bleeding history or any family history of bleeding disorders. Patient expressed understanding and wished to proceed. All questions were answered. Sterile technique was used throughout the entire procedure. Please see nursing notes for vital signs. Test dose was given through epidural needle and negative prior to continuing to dose epidural or start infusion. Warning signs of high block given to the patient including shortness of breath, tingling/numbness in hands, complete motor block, or any concerning symptoms with instructions to call for help. Patient was given  instructions on fall risk and not to get out of bed. All questions and concerns addressed with instructions to call with any issues.   

## 2014-08-13 NOTE — Discharge Summary (Addendum)
Vaginal Delivery Discharge Summary  ALL information will be verified prior to discharge  Diana Peterson  DOB:    August 06, 1980 MRN:    [id number] CSN:    [id number]  Date of admission:                  08/12/14  Date of discharge:                   08/14/14  Procedures this admission: SVD  Date of Delivery: 08/13/14  Newborn Data:  Live born  Information for the patient's newborn:  Jazelyn, Sipe [[id number]]  female    Live born female  Birth Weight: 6 lb 14.2 oz (3124 g) APGAR: 9, 9  Home with mother. Name: Diana Peterson    History of Present Illness: Ms. ASHNA Peterson is a 34 y.o. female, [id number], who presents at [redacted]w[redacted]d weeks gestation. The patient has been followed at the [facility] of [facility]. She was admitted onset of labor. Her pregnancy has been complicated by:  Patient Active Problem List   Diagnosis Date Noted  . Active labor at term 08/13/2014  . SVD (spontaneous vaginal delivery) 08/13/2014  . Obstetric labial laceration, delivered, current hospitalization 08/13/2014    Hospital course: The patient was admitted for labor.   Her labor was not complicated. She proceeded to have a vaginal delivery of a healthy infant. Her delivery was not complicated. Her postpartum course was not complicated. She was discharged to home on postpartum day 2 doing well.  Feeding: breast  Contraception: bilateral tubal ligation in postpartum   Discharge hemoglobin: HEMOGLOBIN  Date Value Ref Range Status  08/14/2014 10.9* 12.0 - 15.0 g/dL Final   HCT  Date Value Ref Range Status  08/14/2014 30.9* 36.0 - 46.0 % Final    PreNatal Labs ABO, Rh: --/--/O POS, O POS (06/24 2310)   Antibody: NEG (06/24 2310) Rubella:    immune RPR: Non Reactive (06/24 2310)  HBsAg: Negative (11/23 0000)  HIV: Non-reactive (11/23 0000)  GBS: Positive (05/31 0000)  Discharge Physical Exam:  General:  alert and cooperative Lochia: appropriate Uterine Fundus: firm Incision: healing well DVT Evaluation: No evidence of DVT seen on physical exam.  Intrapartum Procedures: spontaneous vaginal delivery and GBS prophylaxis Postpartum Procedures: none Complications-Operative and Postpartum: labial laceration  Discharge Diagnoses: Term Pregnancy-delivered,  asymptomatic anemia  Activity:           pelvic rest Diet:                routine Medications: PNV, Ibuprofen, Iron and Percocet Condition:      stable     Postpartum Teaching: Nutrition, exercise, return to work or school, family visits, sexual activity, home rest, vaginal bleeding, pelvic rest, family planning, s/s of PPD, breast care peri-care and incision care   Discharge to: home  Follow-up Information    Follow up with [facility]. Schedule an appointment as soon as possible for a visit in 6 weeks.   Specialty:  Obstetrics and Gynecology   Why:  Postpartum check up   Contact information:   [address] [place] [address] [place] [address] [phone]       Jazia Faraci, CNM, MSN 08/14/2014. 11:12 AM  All information will be verified prior to discharge  Discharge on hold until tomorrow 08/15/14 d/t infants discharge   Postpartum Care After Vaginal Delivery  After you deliver your newborn (postpartum period), the  usual stay in the hospital is 24 72 hours. If there were problems with your labor or delivery, or if you have other medical problems, you might be in the hospital longer.  While you are in the hospital, you will receive help and instructions on how to care for yourself and your newborn during the postpartum period.  While you are in the hospital:  Be sure to tell your nurses if you have pain or discomfort, as well as where you feel the pain and what makes the pain worse.  If you had an incision made near your vagina (episiotomy) or if you had some tearing  during delivery, the nurses may put ice packs on your episiotomy or tear. The ice packs may help to reduce the pain and swelling.  If you are breastfeeding, you may feel uncomfortable contractions of your uterus for a couple of weeks. This is normal. The contractions help your uterus get back to normal size.  It is normal to have some bleeding after delivery.  For the first 1 3 days after delivery, the flow is red and the amount may be similar to a period.  It is common for the flow to start and stop.  In the first few days, you may pass some small clots. Let your nurses know if you begin to pass large clots or your flow increases.  Do not  flush blood clots down the toilet before having the nurse look at them.  During the next 3 10 days after delivery, your flow should become more watery and pink or brown-tinged in color.  Ten to fourteen days after delivery, your flow should be a small amount of yellowish-white discharge.  The amount of your flow will decrease over the first few weeks after delivery. Your flow may stop in 6 8 weeks. Most women have had their flow stop by 12 weeks after delivery.  You should change your sanitary pads frequently.  Wash your hands thoroughly with soap and water for at least 20 seconds after changing pads, using the toilet, or before holding or feeding your newborn.  You should feel like you need to empty your bladder within the first 6 8 hours after delivery.  In case you become weak, lightheaded, or faint, call your nurse before you get out of bed for the first time and before you take a shower for the first time.  Within the first few days after delivery, your breasts may begin to feel tender and full. This is called engorgement. Breast tenderness usually goes away within 48 72 hours after engorgement occurs. You may also notice milk leaking from your breasts. If you are not breastfeeding, do not stimulate your breasts. Breast stimulation can make your  breasts produce more milk.  Spending as much time as possible with your newborn is very important. During this time, you and your newborn can feel close and get to know each other. Having your newborn stay in your room (rooming in) will help to strengthen the bond with your newborn. It will give you time to get to know your newborn and become comfortable caring for your newborn.  Your hormones change after delivery. Sometimes the hormone changes can temporarily cause you to feel sad or tearful. These feelings should not last more than a few days. If these feelings last longer than that, you should talk to your caregiver.  If desired, talk to your caregiver about methods of family planning or contraception.  Talk to your caregiver about  immunizations. Your caregiver may want you to have the following immunizations before leaving the hospital:  Tetanus, diphtheria, and pertussis (Tdap) or tetanus and diphtheria (Td) immunization. It is very important that you and your family (including grandparents) or others caring for your newborn are up-to-date with the Tdap or Td immunizations. The Tdap or Td immunization can help protect your newborn from getting ill.  Rubella immunization.  Varicella (chickenpox) immunization.  Influenza immunization. You should receive this annual immunization if you did not receive the immunization during your pregnancy. Document Released: 12/02/2006 Document Revised: 10/30/2011 Document Reviewed: 10/02/2011 [facility] Patient Information 2014 [place], [place].   Postpartum Depression and Baby Blues  The postpartum period begins right after the birth of a baby. During this time, there is often a great amount of joy and excitement. It is also a time of considerable changes in the life of the parent(s). Regardless of how many times a mother gives birth, each child brings new challenges and dynamics to the family. It is not unusual to have feelings of excitement accompanied  by confusing shifts in moods, emotions, and thoughts. All mothers are at risk of developing postpartum depression or the "baby blues." These mood changes can occur right after giving birth, or they may occur many months after giving birth. The baby blues or postpartum depression can be mild or severe. Additionally, postpartum depression can resolve rather quickly, or it can be a long-term condition. CAUSES Elevated hormones and their rapid decline are thought to be a main cause of postpartum depression and the baby blues. There are a number of hormones that radically change during and after pregnancy. Estrogen and progesterone usually decrease immediately after delivering your baby. The level of thyroid hormone and various cortisol steroids also rapidly drop. Other factors that play a major role in these changes include major life events and genetics.  RISK FACTORS If you have any of the following risks for the baby blues or postpartum depression, know what symptoms to watch out for during the postpartum period. Risk factors that may increase the likelihood of getting the baby blues or postpartum depression include: 1. Havinga personal or family history of depression. 2. Having depression while being pregnant. 3. Having premenstrual or oral contraceptive-associated mood issues. 4. Having exceptional life stress. 5. Having marital conflict. 6. Lacking a social support network. 7. Having a baby with special needs. 8. Having health problems such as diabetes. SYMPTOMS Baby blues symptoms include:  Brief fluctuations in mood, such as going from extreme happiness to sadness.  Decreased concentration.  Difficulty sleeping.  Crying spells, tearfulness.  Irritability.  Anxiety. Postpartum depression symptoms typically begin within the first month after giving birth. These symptoms include:  Difficulty sleeping or excessive sleepiness.  Marked weight loss.  Agitation.  Feelings of  worthlessness.  Lack of interest in activity or food. Postpartum psychosis is a very concerning condition and can be dangerous. Fortunately, it is rare. Displaying any of the following symptoms is cause for immediate medical attention. Postpartum psychosis symptoms include:  Hallucinations and delusions.  Bizarre or disorganized behavior.  Confusion or disorientation. DIAGNOSIS  A diagnosis is made by an evaluation of your symptoms. There are no medical or lab tests that lead to a diagnosis, but there are various questionnaires that a caregiver may use to identify those with the baby blues, postpartum depression, or psychosis. Often times, a screening tool called the [place] Postnatal Depression Scale is used to diagnose depression in the postpartum period.  TREATMENT The baby blues  usually goes away on its own in 1 to 2 weeks. Social support is often all that is needed. You should be encouraged to get adequate sleep and rest. Occasionally, you may be given medicines to help you sleep.  Postpartum depression requires treatment as it can last several months or longer if it is not treated. Treatment may include individual or group therapy, medicine, or both to address any social, physiological, and psychological factors that may play a role in the depression. Regular exercise, a healthy diet, rest, and social support may also be strongly recommended.  Postpartum psychosis is more serious and needs treatment right away. Hospitalization is often needed. HOME CARE INSTRUCTIONS  Get as much rest as you can. Nap when the baby sleeps.  Exercise regularly. Some women find yoga and walking to be beneficial.  Eat a balanced and nourishing diet.  Do little things that you enjoy. Have a cup of tea, take a bubble bath, read your favorite magazine, or listen to your favorite music.  Avoid alcohol.  Ask for help with household chores, cooking, grocery shopping, or running errands as needed. Do not try  to do everything.  Talk to people close to you about how you are feeling. Get support from your partner, family members, friends, or other new moms.  Try to stay positive in how you think. Think about the things you are grateful for.  Do not spend a lot of time alone.  Only take medicine as directed by your caregiver.  Keep all your postpartum appointments.  Let your caregiver know if you have any concerns. SEEK MEDICAL CARE IF: You are having a reaction or problems with your medicine. SEEK IMMEDIATE MEDICAL CARE IF:  You have suicidal feelings.  You feel you may harm the baby or someone else. Document Released: 11/09/2003 Document Revised: 04/29/2011 Document Reviewed: 12/11/2010 [facility] Patient Information 2014 [place], [place].     Breastfeeding Deciding to breastfeed is one of the best choices you can make for you and your baby. A change in hormones during pregnancy causes your breast tissue to grow and increases the number and size of your milk ducts. These hormones also allow proteins, sugars, and fats from your blood supply to make breast milk in your milk-producing glands. Hormones prevent breast milk from being released before your baby is born as well as prompt milk flow after birth. Once breastfeeding has begun, thoughts of your baby, as well as his or her sucking or crying, can stimulate the release of milk from your milk-producing glands.  BENEFITS OF BREASTFEEDING For Your Baby  Your first milk (colostrum) helps your baby's digestive system function better.   There are antibodies in your milk that help your baby fight off infections.   Your baby has a lower incidence of asthma, allergies, and sudden infant death syndrome.   The nutrients in breast milk are better for your baby than infant formulas and are designed uniquely for your baby's needs.   Breast milk improves your baby's brain development.   Your baby is less likely to develop other conditions,  such as childhood obesity, asthma, or type 2 diabetes mellitus.  For You   Breastfeeding helps to create a very special bond between you and your baby.   Breastfeeding is convenient. Breast milk is always available at the correct temperature and costs nothing.   Breastfeeding helps to burn calories and helps you lose the weight gained during pregnancy.   Breastfeeding makes your uterus contract to its prepregnancy size  faster and slows bleeding (lochia) after you give birth.   Breastfeeding helps to lower your risk of developing type 2 diabetes mellitus, osteoporosis, and breast or ovarian cancer later in life. SIGNS THAT YOUR BABY IS HUNGRY Early Signs of Hunger  Increased alertness or activity.  Stretching.  Movement of the head from side to side.  Movement of the head and opening of the mouth when the corner of the mouth or cheek is stroked (rooting).  Increased sucking sounds, smacking lips, cooing, sighing, or squeaking.  Hand-to-mouth movements.  Increased sucking of fingers or hands. Late Signs of Hunger  Fussing.  Intermittent crying. Extreme Signs of Hunger Signs of extreme hunger will require calming and consoling before your baby will be able to breastfeed successfully. Do not wait for the following signs of extreme hunger to occur before you initiate breastfeeding:   Restlessness.  A loud, strong cry.   Screaming.   BREASTFEEDING BASICS Breastfeeding Initiation  Find a comfortable place to sit or lie down, with your neck and back well supported.  Place a pillow or rolled up blanket under your baby to bring him or her to the level of your breast (if you are seated). Nursing pillows are specially designed to help support your arms and your baby while you breastfeed.  Make sure that your baby's abdomen is facing your abdomen.   Gently massage your breast. With your fingertips, massage from your chest wall toward your nipple in a circular motion.  This encourages milk flow. You may need to continue this action during the feeding if your milk flows slowly.  Support your breast with 4 fingers underneath and your thumb above your nipple. Make sure your fingers are well away from your nipple and your baby's mouth.   Stroke your baby's lips gently with your finger or nipple.   When your baby's mouth is open wide enough, quickly bring your baby to your breast, placing your entire nipple and as much of the colored area around your nipple (areola) as possible into your baby's mouth.   More areola should be visible above your baby's upper lip than below the lower lip.   Your baby's tongue should be between his or her lower gum and your breast.   Ensure that your baby's mouth is correctly positioned around your nipple (latched). Your baby's lips should create a seal on your breast and be turned out (everted).  It is common for your baby to suck about 2-3 minutes in order to start the flow of breast milk. Latching Teaching your baby how to latch on to your breast properly is very important. An improper latch can cause nipple pain and decreased milk supply for you and poor weight gain in your baby. Also, if your baby is not latched onto your nipple properly, he or she may swallow some air during feeding. This can make your baby fussy. Burping your baby when you switch breasts during the feeding can help to get rid of the air. However, teaching your baby to latch on properly is still the best way to prevent fussiness from swallowing air while breastfeeding. Signs that your baby has successfully latched on to your nipple:    Silent tugging or silent sucking, without causing you pain.   Swallowing heard between every 3-4 sucks.    Muscle movement above and in front of his or her ears while sucking.  Signs that your baby has not successfully latched on to nipple:   Sucking sounds or smacking  sounds from your baby while  breastfeeding.  Nipple pain. If you think your baby has not latched on correctly, slip your finger into the corner of your baby's mouth to break the suction and place it between your baby's gums. Attempt breastfeeding initiation again. Signs of Successful Breastfeeding Signs from your baby:   A gradual decrease in the number of sucks or complete cessation of sucking.   Falling asleep.   Relaxation of his or her body.   Retention of a small amount of milk in his or her mouth.   Letting go of your breast by himself or herself. Signs from you:  Breasts that have increased in firmness, weight, and size 1-3 hours after feeding.   Breasts that are softer immediately after breastfeeding.  Increased milk volume, as well as a change in milk consistency and color by the fifth day of breastfeeding.   Nipples that are not sore, cracked, or bleeding. Signs That Your Pecola Leisure is Getting Enough Milk  Wetting at least 3 diapers in a 24-hour period. The urine should be clear and pale yellow by age 76447 days.  At least 3 stools in a 24-hour period by age 76447 days. The stool should be soft and yellow.  At least 3 stools in a 24-hour period by age 764 days. The stool should be seedy and yellow.  No loss of weight greater than 10% of birth weight during the first 64 days of age.  Average weight gain of 4-7 ounces (113-198 g) per week after age 81 days.  Consistent daily weight gain by age 76447 days, without weight loss after the age of 2 weeks. After a feeding, your baby may spit up a small amount. This is common. BREASTFEEDING FREQUENCY AND DURATION Frequent feeding will help you make more milk and can prevent sore nipples and breast engorgement. Breastfeed when you feel the need to reduce the fullness of your breasts or when your baby shows signs of hunger. This is called "breastfeeding on demand." Avoid introducing a pacifier to your baby while you are working to establish breastfeeding (the first 4-6  weeks after your baby is born). After this time you may choose to use a pacifier. Research has shown that pacifier use during the first year of a baby's life decreases the risk of sudden infant death syndrome (SIDS). Allow your baby to feed on each breast as long as he or she wants. Breastfeed until your baby is finished feeding. When your baby unlatches or falls asleep while feeding from the first breast, offer the second breast. Because newborns are often sleepy in the first few weeks of life, you may need to awaken your baby to get him or her to feed. Breastfeeding times will vary from baby to baby. However, the following rules can serve as a guide to help you ensure that your baby is properly fed:  Newborns (babies 39 weeks of age or younger) may breastfeed every 1-3 hours.  Newborns should not go longer than 3 hours during the day or 5 hours during the night without breastfeeding.  You should breastfeed your baby a minimum of 8 times in a 24-hour period until you begin to introduce solid foods to your baby at around 70 months of age. BREAST MILK PUMPING Pumping and storing breast milk allows you to ensure that your baby is exclusively fed your breast milk, even at times when you are unable to breastfeed. This is especially important if you are going back to work while you  are still breastfeeding or when you are not able to be present during feedings. Your lactation consultant can give you guidelines on how long it is safe to store breast milk.  A breast pump is a machine that allows you to pump milk from your breast into a sterile bottle. The pumped breast milk can then be stored in a refrigerator or freezer. Some breast pumps are operated by hand, while others use electricity. Ask your lactation consultant which type will work best for you. Breast pumps can be purchased, but some hospitals and breastfeeding support groups lease breast pumps on a monthly basis. A lactation consultant can teach you how  to hand express breast milk, if you prefer not to use a pump.  CARING FOR YOUR BREASTS WHILE YOU BREASTFEED Nipples can become dry, cracked, and sore while breastfeeding. The following recommendations can help keep your breasts moisturized and healthy:  Avoid using soap on your nipples.   Wear a supportive bra. Although not required, special nursing bras and tank tops are designed to allow access to your breasts for breastfeeding without taking off your entire bra or top. Avoid wearing underwire-style bras or extremely tight bras.  Air dry your nipples for 3-15minutes after each feeding.   Use only cotton bra pads to absorb leaked breast milk. Leaking of breast milk between feedings is normal.   Use lanolin on your nipples after breastfeeding. Lanolin helps to maintain your skin's normal moisture barrier. If you use pure lanolin, you do not need to wash it off before feeding your baby again. Pure lanolin is not toxic to your baby. You may also hand express a few drops of breast milk and gently massage that milk into your nipples and allow the milk to air dry. In the first few weeks after giving birth, some women experience extremely full breasts (engorgement). Engorgement can make your breasts feel heavy, warm, and tender to the touch. Engorgement peaks within 3-5 days after you give birth. The following recommendations can help ease engorgement:  Completely empty your breasts while breastfeeding or pumping. You may want to start by applying warm, moist heat (in the shower or with warm water-soaked hand towels) just before feeding or pumping. This increases circulation and helps the milk flow. If your baby does not completely empty your breasts while breastfeeding, pump any extra milk after he or she is finished.  Wear a snug bra (nursing or regular) or tank top for 1-2 days to signal your body to slightly decrease milk production.  Apply ice packs to your breasts, unless this is too  uncomfortable for you.  Make sure that your baby is latched on and positioned properly while breastfeeding. If engorgement persists after 48 hours of following these recommendations, contact your health care provider or a Advertising copywriter. OVERALL HEALTH CARE RECOMMENDATIONS WHILE BREASTFEEDING  Eat healthy foods. Alternate between meals and snacks, eating 3 of each per day. Because what you eat affects your breast milk, some of the foods may make your baby more irritable than usual. Avoid eating these foods if you are sure that they are negatively affecting your baby.  Drink milk, fruit juice, and water to satisfy your thirst (about 10 glasses a day).   Rest often, relax, and continue to take your prenatal vitamins to prevent fatigue, stress, and anemia.  Continue breast self-awareness checks.  Avoid chewing and smoking tobacco.  Avoid alcohol and drug use. Some medicines that may be harmful to your baby can pass through breast milk.  It is important to ask your health care provider before taking any medicine, including all over-the-counter and prescription medicine as well as vitamin and herbal supplements. It is possible to become pregnant while breastfeeding. If birth control is desired, ask your health care provider about options that will be safe for your baby. SEEK MEDICAL CARE IF:   You feel like you want to stop breastfeeding or have become frustrated with breastfeeding.  You have painful breasts or nipples.  Your nipples are cracked or bleeding.  Your breasts are red, tender, or warm.  You have a swollen area on either breast.  You have a fever or chills.  You have nausea or vomiting.  You have drainage other than breast milk from your nipples.  Your breasts do not become full before feedings by the fifth day after you give birth.  You feel sad and depressed.  Your baby is too sleepy to eat well.  Your baby is having trouble sleeping.   Your baby is wetting  less than 3 diapers in a 24-hour period.  Your baby has less than 3 stools in a 24-hour period.  Your baby's skin or the white part of his or her eyes becomes yellow.   Your baby is not gaining weight by 39 days of age. SEEK IMMEDIATE MEDICAL CARE IF:   Your baby is overly tired (lethargic) and does not want to wake up and feed.  Your baby develops an unexplained fever. Document Released: 02/04/2005 Document Revised: 02/09/2013 Document Reviewed: 07/29/2012 [facility] Patient Information 2015 [place], [place]. This information is not intended to replace advice given to you by your health care provider. Make sure you discuss any questions you have with your health care provider.

## 2014-08-14 LAB — CBC
HCT: 30.9 % — ABNORMAL LOW (ref 36.0–46.0)
Hemoglobin: 10.9 g/dL — ABNORMAL LOW (ref 12.0–15.0)
MCH: 31.8 pg (ref 26.0–34.0)
MCHC: 35.3 g/dL (ref 30.0–36.0)
MCV: 90.1 fL (ref 78.0–100.0)
Platelets: 189 10*3/uL (ref 150–400)
RBC: 3.43 MIL/uL — ABNORMAL LOW (ref 3.87–5.11)
RDW: 14.7 % (ref 11.5–15.5)
WBC: 10.1 10*3/uL (ref 4.0–10.5)

## 2014-08-14 MED ORDER — OXYCODONE-ACETAMINOPHEN 5-325 MG PO TABS: 1.0000 | ORAL_TABLET | ORAL | Status: DC | PRN

## 2014-08-14 MED ORDER — FERROUS SULFATE 325 (65 FE) MG PO TBEC: 325.0000 mg | DELAYED_RELEASE_TABLET | Freq: Two times a day (BID) | ORAL | Status: DC

## 2014-08-14 MED ORDER — IBUPROFEN 600 MG PO TABS: 600.0000 mg | ORAL_TABLET | Freq: Four times a day (QID) | ORAL | Status: DC

## 2014-08-14 NOTE — Lactation Note (Signed)
This note was copied from the chart of Diana Inabelle Engebretson. Lactation Consultation Note Baby cont. To be fussy at intervals. BF for long periods then crying wanting to BF every hour. Moms breast gets some relief from BF but not enough. When baby cries her tongue curls up to top.  Fitted mom w/#20NS, taught mom application. Baby latched well. Gave 40ml colostrum to get BF well. Taught mom "C" hold for latching. Baby doing big swallows. Mom states much better. Encouraged breast massage some during BF.  Mom shown how to use DEBP & how to disassemble, clean, & reassemble parts by RN. Mom knows to pump q3h for 15-20 min. If needed to relieve engorgement. Encouraged comfort during BF so colostrum flows better and mom will enjoy the feeding longer. Taking deep breaths and breast massage during BF.  Patient Name: Diana Peterson [id number] Date: 08/14/2014 Reason for consult: Follow-up assessment   Maternal Data    Feeding Feeding Type: Breast Milk Length of feed: 15 min (still BF)  LATCH Score/Interventions Latch: Grasps breast easily, tongue down, lips flanged, rhythmical sucking. Intervention(s): Teach feeding cues;Waking techniques Intervention(s): Adjust position;Assist with latch;Breast massage;Breast compression  Audible Swallowing: Spontaneous and intermittent Intervention(s): Skin to skin;Hand expression Intervention(s): Alternate breast massage  Type of Nipple: Everted at rest and after stimulation  Comfort (Breast/Nipple): Filling, red/small blisters or bruises, mild/mod discomfort  Problem noted: Mild/Moderate discomfort;Filling Interventions (Filling): Massage;Firm support;Frequent nursing;Double electric pump Interventions (Mild/moderate discomfort): Breast shields;Post-pump;Hand expression;Hand massage  Hold (Positioning): Assistance needed to correctly position infant at breast and maintain latch.  LATCH Score: 8  Lactation Tools Discussed/Used Tools: Nipple  Dorris Carnes;Pump Nipple shield size: 20 Breast pump type: Double-Electric Breast Pump Pump Review: Setup, frequency, and cleaning;Milk Storage Initiated by:: RN Date initiated:: 08/14/14   Consult Status Consult Status: Follow-up Follow-up type: In-patient    Charyl Dancer 08/14/2014, 4:08 PM

## 2014-08-14 NOTE — Lactation Note (Signed)
This note was copied from the chart of Diana Marilea Kaelin. Lactation Consultation Note Mom didn't BF her first child d/t eclampsia. Mom denies painful latches. Baby is cluster feeding. Moms breast are filling and heavy. Mom put on a supportive bra. Engorgement management discussed. ICE pks given to apply to breast. Encouraged breast massage at intervals during BF.  Mom denies soreness to everted nipples. States baby has a wide open flange. [facility] BF classes at [facility]. Has hand pump. Baby has good output, 6 voids and 7 stools at 27 hrs. Old. Mom states she hears swallows. Mom encouraged to feed baby 8-12 times/24 hours and with feeding cues. Mom encouraged to waken baby for feeds. Mom reports + breast changes w/pregnancy. Educated about newborn behavior, cluster feeding, I&O, supply and demand. Referred to Baby and Me Book in Breastfeeding section Pg. 22-23 for position options and Proper latch demonstration. Mom states she hand expresses before latching. WH/LC brochure given w/resources, support groups and LC services. Patient Name: Diana Peterson [id number] Date: 08/14/2014 Reason for consult: Initial assessment   Maternal Data    Feeding Feeding Type: Breast Fed Length of feed: 25 min  LATCH Score/Interventions    Intervention(s): Hand expression  Type of Nipple: Everted at rest and after stimulation  Comfort (Breast/Nipple): Filling, red/small blisters or bruises, mild/mod discomfort  Problem noted: Filling Interventions (Filling): Massage;Firm support;Frequent nursing;Hand pump  Intervention(s): Skin to skin;Position options;Support Pillows;Breastfeeding basics reviewed     Lactation Tools Discussed/Used Tools: Pump Breast pump type: Manual Pump Review: Setup, frequency, and cleaning;Milk Storage Initiated by:: RN Date initiated:: 08/13/14   Consult Status Consult Status: Follow-up Date: 08/15/14 Follow-up type: In-patient    Charyl Dancer 08/14/2014,  8:47 AM

## 2014-08-15 ENCOUNTER — Inpatient Hospital Stay (HOSPITAL_COMMUNITY): Admission: RE | Admit: 2014-08-15 | Payer: Medicaid Other | Source: Ambulatory Visit

## 2014-08-15 NOTE — Lactation Note (Signed)
This note was copied from the chart of Diana Peterson. Lactation Consultation Note  Patient Name: Diana Peterson [id number] Date: 08/15/2014 Reason for consult: Follow-up assessment  With this mom of a term baby, now 93 hours old. Mom already has a great milk supply. She is slightly engorged, but soft. She has a knot of milk on the upper outer side of her left breast. Mom has been icing and massaging. I advised her to add heat with massage and pumping/brestfeeding, and ice in between. If mom is to get very engorged, I advised her to lay flat in bed, and use coconut oil to massage her breasts toward her armpits, until her breast soften and milk flows. Mom if breast feeding with a nipple shiled due to sore nipple, and has an o/p consult scheduled for 7/5. I gave mom a foley cup to use to supplement the baby, and instructed her in it's use.   Maternal Data    Feeding    LATCH Score/Interventions          Comfort (Breast/Nipple): Engorged, cracked, bleeding, large blisters, severe discomfort Problem noted: Engorgment Intervention(s): Ice  Problem noted: Filling;Mild/Moderate discomfort Interventions (Filling): Massage;Double electric pump        Lactation Tools Discussed/Used     Consult Status Consult Status: Complete Date: 08/23/14 Follow-up type: Out-patient    Alfred Levins 08/15/2014, 11:56 AM

## 2014-08-15 NOTE — Progress Notes (Signed)
UR chart review completed.  

## 2014-08-23 ENCOUNTER — Ambulatory Visit (HOSPITAL_COMMUNITY): Admit: 2014-08-23 | Payer: Medicaid Other

## 2014-11-07 ENCOUNTER — Other Ambulatory Visit: Payer: Self-pay | Admitting: Obstetrics & Gynecology

## 2014-11-08 ENCOUNTER — Other Ambulatory Visit: Payer: Self-pay | Admitting: Obstetrics & Gynecology

## 2014-11-17 ENCOUNTER — Encounter (HOSPITAL_COMMUNITY): Payer: Self-pay | Admitting: Emergency Medicine

## 2014-11-17 ENCOUNTER — Encounter (HOSPITAL_COMMUNITY)
Admission: AD | Disposition: A | Discharge status: Home / Self Care | Payer: Self-pay | Source: Ambulatory Visit | Attending: Obstetrics & Gynecology

## 2014-11-17 ENCOUNTER — Ambulatory Visit (HOSPITAL_COMMUNITY): Payer: Medicaid Other | Admitting: Anesthesiology

## 2014-11-17 ENCOUNTER — Ambulatory Visit (HOSPITAL_COMMUNITY)
Admission: AD | Admit: 2014-11-17 | Discharge: 2014-11-17 | Disposition: A | Discharge status: Home / Self Care | Payer: Medicaid Other | Source: Ambulatory Visit | Attending: Obstetrics & Gynecology | Admitting: Obstetrics & Gynecology

## 2014-11-17 DIAGNOSIS — Z87891 Personal history of nicotine dependence: Secondary | ICD-10-CM | POA: Diagnosis not present

## 2014-11-17 DIAGNOSIS — J45909 Unspecified asthma, uncomplicated: Secondary | ICD-10-CM | POA: Diagnosis not present

## 2014-11-17 DIAGNOSIS — Z6833 Body mass index (BMI) 33.0-33.9, adult: Secondary | ICD-10-CM | POA: Diagnosis not present

## 2014-11-17 DIAGNOSIS — Z302 Encounter for sterilization: Secondary | ICD-10-CM | POA: Insufficient documentation

## 2014-11-17 HISTORY — PX: LAPAROSCOPIC BILATERAL SALPINGECTOMY: SHX5889

## 2014-11-17 LAB — CBC
HEMATOCRIT: 33.1 % — AB (ref 36.0–46.0)
Hemoglobin: 11.1 g/dL — ABNORMAL LOW (ref 12.0–15.0)
MCH: 30.2 pg (ref 26.0–34.0)
MCHC: 33.5 g/dL (ref 30.0–36.0)
MCV: 89.9 fL (ref 78.0–100.0)
Platelets: 248 10*3/uL (ref 150–400)
RBC: 3.68 MIL/uL — AB (ref 3.87–5.11)
RDW: 14.1 % (ref 11.5–15.5)
WBC: 4.6 10*3/uL (ref 4.0–10.5)

## 2014-11-17 LAB — PREGNANCY, URINE: Preg Test, Ur: NEGATIVE

## 2014-11-17 SURGERY — SALPINGECTOMY, BILATERAL, LAPAROSCOPIC
Anesthesia: General | Site: Abdomen | Laterality: Bilateral

## 2014-11-17 MED ORDER — NEOSTIGMINE METHYLSULFATE 10 MG/10ML IV SOLN
INTRAVENOUS | Status: DC | PRN
Start: 2014-11-17 — End: 2014-11-17
  Administered 2014-11-17: 3 mg via INTRAVENOUS

## 2014-11-17 MED ORDER — GLYCOPYRROLATE 0.2 MG/ML IJ SOLN
INTRAMUSCULAR | Status: AC
  Filled 2014-11-17: qty 2

## 2014-11-17 MED ORDER — FENTANYL CITRATE (PF) 100 MCG/2ML IJ SOLN
INTRAMUSCULAR | Status: AC
  Filled 2014-11-17: qty 4

## 2014-11-17 MED ORDER — MIDAZOLAM HCL 2 MG/2ML IJ SOLN
INTRAMUSCULAR | Status: AC
  Filled 2014-11-17: qty 4

## 2014-11-17 MED ORDER — IBUPROFEN 600 MG PO TABS: 600.0000 mg | ORAL_TABLET | Freq: Four times a day (QID) | ORAL | Status: DC | PRN

## 2014-11-17 MED ORDER — GLYCOPYRROLATE 0.2 MG/ML IJ SOLN
INTRAMUSCULAR | Status: DC | PRN
  Administered 2014-11-17: 0.4 mg via INTRAVENOUS

## 2014-11-17 MED ORDER — BUPIVACAINE-EPINEPHRINE (PF) 0.25% -1:200000 IJ SOLN
INTRAMUSCULAR | Status: AC
  Filled 2014-11-17: qty 30

## 2014-11-17 MED ORDER — ACETAMINOPHEN 160 MG/5ML PO SOLN: 325.0000 mg | ORAL | Status: DC | PRN

## 2014-11-17 MED ORDER — LIDOCAINE HCL (CARDIAC) 20 MG/ML IV SOLN
INTRAVENOUS | Status: AC
  Filled 2014-11-17: qty 5

## 2014-11-17 MED ORDER — ONDANSETRON HCL 4 MG/2ML IJ SOLN
INTRAMUSCULAR | Status: AC
  Filled 2014-11-17: qty 2

## 2014-11-17 MED ORDER — DEXAMETHASONE SODIUM PHOSPHATE 4 MG/ML IJ SOLN
INTRAMUSCULAR | Status: AC
  Filled 2014-11-17: qty 1

## 2014-11-17 MED ORDER — LIDOCAINE HCL (CARDIAC) 20 MG/ML IV SOLN
INTRAVENOUS | Status: DC | PRN
  Administered 2014-11-17 (×2): 50 mg via INTRAVENOUS

## 2014-11-17 MED ORDER — LABETALOL HCL 5 MG/ML IV SOLN
INTRAVENOUS | Status: DC | PRN
  Administered 2014-11-17 (×3): 5 mg via INTRAVENOUS

## 2014-11-17 MED ORDER — BUPIVACAINE HCL (PF) 0.25 % IJ SOLN
INTRAMUSCULAR | Status: AC
  Filled 2014-11-17: qty 30

## 2014-11-17 MED ORDER — FENTANYL CITRATE (PF) 100 MCG/2ML IJ SOLN: 25.0000 ug | INTRAMUSCULAR | Status: DC | PRN

## 2014-11-17 MED ORDER — BUPIVACAINE-EPINEPHRINE 0.25% -1:200000 IJ SOLN
INTRAMUSCULAR | Status: DC | PRN
  Administered 2014-11-17: 12 mL

## 2014-11-17 MED ORDER — PROPOFOL 10 MG/ML IV BOLUS
INTRAVENOUS | Status: AC
  Filled 2014-11-17: qty 20

## 2014-11-17 MED ORDER — STERILE WATER FOR IRRIGATION IR SOLN
Status: DC | PRN
  Administered 2014-11-17: 1000 mL

## 2014-11-17 MED ORDER — ONDANSETRON HCL 4 MG/2ML IJ SOLN
INTRAMUSCULAR | Status: DC | PRN
  Administered 2014-11-17: 4 mg via INTRAVENOUS

## 2014-11-17 MED ORDER — PROPOFOL 10 MG/ML IV BOLUS
INTRAVENOUS | Status: DC | PRN
  Administered 2014-11-17: 150 mg via INTRAVENOUS
  Administered 2014-11-17: 50 mg via INTRAVENOUS

## 2014-11-17 MED ORDER — OXYCODONE HCL 5 MG/5ML PO SOLN: 5.0000 mg | Freq: Once | ORAL | Status: DC | PRN

## 2014-11-17 MED ORDER — OXYCODONE-ACETAMINOPHEN 5-325 MG PO TABS
1.0000 | ORAL_TABLET | ORAL | Status: DC | PRN
Start: 2014-11-17 — End: 2021-01-18

## 2014-11-17 MED ORDER — FENTANYL CITRATE (PF) 100 MCG/2ML IJ SOLN
INTRAMUSCULAR | Status: DC | PRN
  Administered 2014-11-17: 150 ug via INTRAVENOUS
  Administered 2014-11-17 (×2): 100 ug via INTRAVENOUS

## 2014-11-17 MED ORDER — LACTATED RINGERS IV SOLN
INTRAVENOUS | Status: DC
  Administered 2014-11-17 (×2): via INTRAVENOUS

## 2014-11-17 MED ORDER — KETOROLAC TROMETHAMINE 30 MG/ML IJ SOLN
INTRAMUSCULAR | Status: DC | PRN
  Administered 2014-11-17: 30 mg via INTRAVENOUS

## 2014-11-17 MED ORDER — SCOPOLAMINE 1 MG/3DAYS TD PT72
1.0000 | MEDICATED_PATCH | Freq: Once | TRANSDERMAL | Status: DC
  Administered 2014-11-17: 1.5 mg via TRANSDERMAL

## 2014-11-17 MED ORDER — KETOROLAC TROMETHAMINE 30 MG/ML IJ SOLN
INTRAMUSCULAR | Status: AC
  Filled 2014-11-17: qty 1

## 2014-11-17 MED ORDER — MIDAZOLAM HCL 2 MG/2ML IJ SOLN
INTRAMUSCULAR | Status: DC | PRN
  Administered 2014-11-17: 2 mg via INTRAVENOUS

## 2014-11-17 MED ORDER — NEOSTIGMINE METHYLSULFATE 10 MG/10ML IV SOLN
INTRAVENOUS | Status: AC
  Filled 2014-11-17: qty 1

## 2014-11-17 MED ORDER — SCOPOLAMINE 1 MG/3DAYS TD PT72
MEDICATED_PATCH | TRANSDERMAL | Status: AC
  Administered 2014-11-17: 1.5 mg via TRANSDERMAL
  Filled 2014-11-17: qty 1

## 2014-11-17 MED ORDER — FENTANYL CITRATE (PF) 250 MCG/5ML IJ SOLN
INTRAMUSCULAR | Status: AC
  Filled 2014-11-17: qty 25

## 2014-11-17 MED ORDER — DEXAMETHASONE SODIUM PHOSPHATE 10 MG/ML IJ SOLN
INTRAMUSCULAR | Status: DC | PRN
  Administered 2014-11-17: 4 mg via INTRAVENOUS

## 2014-11-17 MED ORDER — OXYCODONE HCL 5 MG PO TABS: 5.0000 mg | ORAL_TABLET | Freq: Once | ORAL | Status: DC | PRN

## 2014-11-17 MED ORDER — ROCURONIUM BROMIDE 100 MG/10ML IV SOLN
INTRAVENOUS | Status: DC | PRN
  Administered 2014-11-17: 10 mg via INTRAVENOUS
  Administered 2014-11-17: 40 mg via INTRAVENOUS

## 2014-11-17 MED ORDER — ACETAMINOPHEN 325 MG PO TABS: 325.0000 mg | ORAL_TABLET | ORAL | Status: DC | PRN

## 2014-11-17 SURGICAL SUPPLY — 29 items
BAG SPEC RTRVL LRG 6X4 10 (ENDOMECHANICALS) ×1
CABLE HIGH FREQUENCY MONO STRZ (ELECTRODE) IMPLANT
CHLORAPREP W/TINT 26ML (MISCELLANEOUS) ×3 IMPLANT
CLOTH BEACON ORANGE TIMEOUT ST (SAFETY) ×3 IMPLANT
DECANTER SPIKE VIAL GLASS SM (MISCELLANEOUS) ×3 IMPLANT
DRSG COVADERM PLUS 2X2 (GAUZE/BANDAGES/DRESSINGS) ×6 IMPLANT
DRSG OPSITE POSTOP 3X4 (GAUZE/BANDAGES/DRESSINGS) ×2 IMPLANT
GLOVE BIOGEL PI IND STRL 7.0 (GLOVE) ×1 IMPLANT
GLOVE BIOGEL PI INDICATOR 7.0 (GLOVE) ×2
GLOVE SURG SS PI 6.5 STRL IVOR (GLOVE) ×3 IMPLANT
GOWN STRL REUS W/TWL LRG LVL3 (GOWN DISPOSABLE) ×9 IMPLANT
LIGASURE 5MM LAPAROSCOPIC (INSTRUMENTS) ×2 IMPLANT
LIQUID BAND (GAUZE/BANDAGES/DRESSINGS) ×2 IMPLANT
NEEDLE INSUFFLATION 120MM (ENDOMECHANICALS) ×2 IMPLANT
NS IRRIG 1000ML POUR BTL (IV SOLUTION) ×3 IMPLANT
PACK LAPAROSCOPY BASIN (CUSTOM PROCEDURE TRAY) ×3 IMPLANT
PAD POSITIONING PINK XL (MISCELLANEOUS) ×3 IMPLANT
POUCH SPECIMEN RETRIEVAL 10MM (ENDOMECHANICALS) ×2 IMPLANT
SET IRRIG TUBING LAPAROSCOPIC (IRRIGATION / IRRIGATOR) IMPLANT
SOLUTION ELECTROLUBE (MISCELLANEOUS) ×2 IMPLANT
SUT MNCRL AB 4-0 PS2 18 (SUTURE) ×3 IMPLANT
SUT VICRYL 0 UR6 27IN ABS (SUTURE) ×2 IMPLANT
TOWEL OR 17X24 6PK STRL BLUE (TOWEL DISPOSABLE) ×6 IMPLANT
TRAY FOLEY CATH SILVER 14FR (SET/KITS/TRAYS/PACK) ×2 IMPLANT
TROCAR HASSON GELL 12X100 (TROCAR) ×2 IMPLANT
TROCAR XCEL NON-BLD 11X100MML (ENDOMECHANICALS) ×2 IMPLANT
TROCAR XCEL NON-BLD 5MMX100MML (ENDOMECHANICALS) ×4 IMPLANT
WARMER LAPAROSCOPE (MISCELLANEOUS) ×3 IMPLANT
WATER STERILE IRR 1000ML POUR (IV SOLUTION) ×3 IMPLANT

## 2014-11-17 NOTE — Anesthesia Procedure Notes (Signed)
Procedure Name: Intubation Date/Time: 11/17/2014 2:26 PM Performed by: Shanon Payor Pre-anesthesia Checklist: Patient identified, Emergency Drugs available, Suction available, Timeout performed and Patient being monitored Patient Re-evaluated:Patient Re-evaluated prior to inductionOxygen Delivery Method: Circle system utilized Preoxygenation: Pre-oxygenation with 100% oxygen Intubation Type: IV induction Ventilation: Mask ventilation without difficulty Laryngoscope Size: Mac and 3 Grade View: Grade I Tube type: Oral Tube size: 7.0 mm Number of attempts: 1 Airway Equipment and Method: Stylet Placement Confirmation: ETT inserted through vocal cords under direct vision,  positive ETCO2 and breath sounds checked- equal and bilateral Secured at: 22 cm Tube secured with: Tape Dental Injury: Teeth and Oropharynx as per pre-operative assessment

## 2014-11-17 NOTE — Discharge Instructions (Signed)
No IBUPROFEN products until 10 pm  -Nothing in vagina for next two weeks: no sex, tampons or douching. -you may have some back and shoulder pain after the procedure, this should improve with time and as you take your pain medications.  Be sure to ambulate regularly as this may help to improve the back/shoulder pain. -Call me if your pain is not improving despite medication use.  -Remove the dressings in 3 days (sunday). -Do not do any heavy lifting, it. Nothing more than 20 lbs.    Diagnostic Laparoscopy  Laparoscopy is a surgical procedure. It is used to diagnose and treat diseases inside the belly (abdomen). It is usually a brief, common, and relatively simple procedure. The laparoscopeis a thin, lighted, pencil-sized instrument. It is like a telescope. It is inserted into your abdomen through a small cut (incision). Your caregiver can look at the organs inside your body through this instrument. He or she can see if there is anything abnormal.  Laparoscopy can be done either in a hospital or outpatient clinic. You may be given a mild sedative to help you relax before the procedure. Once in the operating room, you will be given a drug to make you sleep (general anesthesia). Laparoscopy usually lasts less than 1 hour. After the procedure, you will be monitored in a recovery area until you are stable and doing well. Once you are home, it will take 2 to 3 days to fully recover.  RISKS AND COMPLICATIONS  Laparoscopy has relatively few risks. Your caregiver will discuss the risks with you before the procedure.  Some problems that can occur include:  Infection.  Bleeding.  Damage to other organs.  Anesthetic side effects. PROCEDURE  Once you receive anesthesia, your surgeon inflates the abdomen with a harmless gas (carbon dioxide). This makes the organs easier to see. The laparoscope is inserted into the abdomen through a small incision. This allows your surgeon to see into the abdomen. Other small  instruments are also inserted into the abdomen through other small openings. Many surgeons attach a video camera to the laparoscope to enlarge the view.  During a diagnostic laparoscopy, the surgeon may be looking for inflammation, infection, or cancer. Your surgeon may take tissue samples(biopsies). The samples are sent to a specialist in looking at cells and tissue samples (pathologist). The pathologist examines them under a microscope. Biopsies can help to diagnose or confirm a disease.  AFTER THE PROCEDURE  The gas is released from inside the abdomen.  The incisions are closed with stitches (sutures). Because these incisions are small (usually less than 1/2 inch), there is usually minimal discomfort after the procedure. There may be some mild discomfort in the throat. This is from the tube placed in the throat while you were sleeping. You may have some mild abdominal discomfort. There may also be discomfort from the instrument placement incisions in the abdomen.  The recovery time is shortened as long as there are no complications.  You will rest in a recovery room until stable and doing well. As long as there are no complications, you may be allowed to go home. FINDING OUT THE RESULTS OF YOUR TEST  Not all test results are available during your visit. If your test results are not back during the visit, make an appointment with your caregiver to find out the results. Do not assume everything is normal if you have not heard from your caregiver or the medical facility. It is important for you to follow up on all  of your test results.  HOME CARE INSTRUCTIONS  Take all medicines as directed.  Only take over-the-counter or prescription medicines for pain, discomfort, or fever as directed by your caregiver.  Resume daily activities as directed.  Showers are preferred over baths.  You may resume sexual activities in 1 week or as directed.  Do not drive while taking narcotics. SEEK MEDICAL CARE IF:    There is increasing abdominal pain.  There is new pain in the shoulders (shoulder strap areas).  You feel lightheaded or faint.  You have the chills.  You or your child has an oral temperature above 102 F (38.9 C).  There is pus-like (purulent) drainage from any of the wounds.  You are unable to pass gas or have a bowel movement.  You feel sick to your stomach (nauseous) or throw up (vomit). MAKE SURE YOU:  Understand these instructions.  Will watch your condition.  Will get help right away if you are not doing well or get worse. Document Released: 05/13/2000 Document Revised: 06/01/2012 Document Reviewed: 02/04/2007  [facility] Patient Information 2015 [place], [place]. This information is not intended to replace advice given to you by your health care provider. Make sure you discuss any questions you have with your health care provider    Salpingectomy, Care After  Refer to this sheet in the next few weeks. These instructions provide you with information on caring for yourself after your procedure. Your health care provider may also give you more specific instructions. Your treatment has been planned according to current medical practices, but problems sometimes occur. Call your health care provider if you have any problems or questions after your procedure.  WHAT TO EXPECT AFTER THE PROCEDURE  After your procedure, it is typical to have the following:  Abdominal pain that can be controlled with pain medicine.  Vaginal spotting.  Tiredness. HOME CARE INSTRUCTIONS  Get plenty of rest and sleep.  Only take over-the-counter or prescription medicines as directed by your health care provider.  Keep incision areas clean and dry. Remove or change any bandages (dressings) only as directed by your health care provider.  You may resume your regular diet. Eat a well-balanced diet.  Drink enough fluids to keep your urine clear or pale yellow.  Limit exercise and activities as directed by your  health care provider. Do not lift anything heavier than 5 lb (2.3 kg) until your health care provider approves.  Do not drive until your health care provider approves.  Do not have sexual intercourse until your health care provider says it is okay.  Take your temperature twice a day for the first week. Write those temperatures down.  Follow up with your health care provider as directed. SEEK MEDICAL CARE IF:  You have pain when you urinate.  You see pus coming out of the incision, or the incision is separating.  You have increasing abdominal pain.  You have swelling or redness in the incision area.  You develop a rash.  You feel lightheaded.  You have pain that is not controlled with medicine. SEEK IMMEDIATE MEDICAL CARE IF:  You develop a fever.  You have increasing abdominal pain.  You develop chest or leg pain.  You develop shortness of breath.  You pass out. Document Released: 05/11/2010 Document Revised: 11/25/2012 Document Reviewed: 07/29/2012  [facility] Patient Information 2015 [place], [place]. This information is not intended to replace advice given to you by your health care provider. Make sure you discuss any questions you have with  your health care provider

## 2014-11-17 NOTE — H&P (Signed)
Diana Peterson is an 34 y.o. female P2 who does not desire to have any more children and wishes to have permanent sterilization by laparoscopic bilateral salpingectomy.   Pertinent Gynecological History: Menses: Irregular, hx of PCOS Bleeding: None Contraception: Desires permanent sterilization.  DES exposure: unknown Blood transfusions: none Sexually transmitted diseases: with history of chlamydia. S/p treatment Previous GYN Procedures: None  Last pap: normal Date: 09/2012 OB History: G3 P2012       Past Medical History  Diagnosis Date  . Anemia   . Hx of chlamydia infection   . Hx of varicella   . History of gestational hypertension   . Asthma     Past Surgical History  Procedure Laterality Date  . Coccyx fracture surgery      2002 and 2003    Family History  Problem Relation Age of Onset  . Hyperlipidemia Father   . Hypertension Father   . Diabetes Paternal Aunt   . Diabetes Paternal Uncle   . Diabetes Paternal Grandmother   . Birth defects Neg Hx   . Asthma Neg Hx   . Arthritis Neg Hx   . Alcohol abuse Neg Hx   . Cancer Neg Hx   . COPD Neg Hx   . Depression Neg Hx   . Drug abuse Neg Hx   . Early death Neg Hx   . Hearing loss Neg Hx   . Heart disease Neg Hx   . Kidney disease Neg Hx   . Learning disabilities Neg Hx   . Mental illness Neg Hx   . Mental retardation Neg Hx   . Miscarriages / Stillbirths Neg Hx   . Stroke Neg Hx   . Vision loss Neg Hx   . Varicose Veins Neg Hx     Social History:  reports that she quit smoking about a year ago. She has never used smokeless tobacco. She reports that she does not drink alcohol or use illicit drugs.  Allergies: No Known Allergies  No prescriptions prior to admission    ROS  Constitutional: Denies fevers/chills Cardiovascular: Denies chest pain or palpitations Pulmonary: Denies coughing or wheezing Gastrointestinal: Denies nausea, vomiting or diarrhea Genitourinary: Denies pelvic pain, unusual  vaginal bleeding, unusual vaginal discharge, dysuria, urgency or frequency.  Musculoskeletal: Denies muscle or joint aches and pain.  Neurology: Denies abnormal sensations such as tingling or numbness.   unknown if currently breastfeeding. Physical Exam  Gen: NAD CVS: s1, s2, RRR Pulm: CTAB Abd: Soft/Non-tender, non distended. Ext: warm and well perfused, no edema, no calf tenderness  No results found for this or any previous visit (from the past 24 hour(s)).  No results found.  Assessment/Plan: 34 y/o P2 desiring permanent sterilization.  We discussed risks, benefits and alternatives of permanent sterilization including risks of regret but  Patient states she [place] 100% sure she does not want any more children.  She did not desire temporary birth control options or vasectomy for birth control.  She desires laparoscopic bilateral salpingectomy.  We discussed risks, benefits and alternatives of the procedure including risks of bleeding, infection, damage to organs.  All questions were answered.  She originally signed consent on 09/26/2014.     [facility] 11/17/2014, 1:09 PM

## 2014-11-17 NOTE — Op Note (Addendum)
DATE:  11/17/14  Patient: Diana Peterson, Diana Peterson DOB: 03/11/1980  PREOP DIAGNOSIS: Multiparous patient desiring permanent sterilization and also desiring to decrease her future risk of fallopian tube and ovarian cancer  POSTOP DIAGNOSIS: Same as above  PROCEDURE: Laparoscopic bilateral salpingectomy  SURGEON: Dr. Hoover Browns  ASSISTANT: Scrub tech: Lambert Mody  ANESTHESIA: General.  COMPLICATIONS: None  EBL: 20 mL  IV FLUID: 1800 mL LR  URINE OUTPUT: 600 mL  FINDINGS: Normal uterus. Normal fallopian tubes bilaterally. Normal left and right ovaries.   PROCEDURE:   Informed consent was obtained from the patient to undergo the procedure after discussing the risks benefits and alternatives of the procedure. She was taken to the operating room where anesthesia was administered without difficulty. Both arm was tucked and she was placed in the dorsal lithotomy position. Two labial lesions about 0.5 cm, pink, ulcerated were noted before the procedure began, one in left labia majora and one in the right labia majora.  My suspicion was ingrown hair lesions versus herpes, herpes cultures were drawn over lesion.  She was prepped abdominally and also vaginally and perineum in the usual sterile fashion. Foley catheter was placed in the bladder and a Cohen uterine manipulator was was placed through cervix with a single tooth tenaculum to anchor it.    Attention was then turned to the abdomen where marcaine with epinephrine was instilled in the infraumbilical area. The skin was incised with a scalpel and and entry into the abdomen was made through the subcutaneous, fascia and peritoneal layers using Allis clamps hemostats and S retractors for retraction. The fascia was tied with 0 Vicryl in a pursestring suture. The Hasson trocar was placed in and gas 20 mL gas instilled into the bulb. The abdomen was insufflated with gas. The scope was then placed in and the pelvis was visualized. Two 5 mm ports were placed the  using the XL trocar on the right and left lower abdominal quadrants under direct visualization and lateral to the inferior epigastrics. The right fallopian tube was then removed using the LigaSure were. It was then placed in the anterior pelvic cavity. The left fallopian tube was similarly removed using the LigaSure. The specimens were then removed through the 10mm port.  The pelvis was inspected and he was noted to be hemostatic. Small blood in the cul de sac was suctioned out.  All instruments were removed under direct visualization with good hemostasis noted.   The umbilical incision was closed using 0 Vicryl.  The subcutaneous was also closed off with vicryl.  The skin was closed with 4-0 Monocryl. The 2 side ports incisions were also closed with 4-0 Monocryl. Dermabond and Tegaderm dressings were placed. The Foley catheter and Cohen manipulators were then removed. The patient was then awoken from anesthesia and she was taken to recovery room in stable condition  SPECIMEN: Left and right fallopian tubes

## 2014-11-17 NOTE — Anesthesia Preprocedure Evaluation (Signed)
Anesthesia Evaluation  Patient identified by MRN, date of birth, ID band Patient awake    Reviewed: Allergy & Precautions, NPO status , Patient's Chart, lab work & pertinent test results  History of Anesthesia Complications Negative for: history of anesthetic complications  Airway Mallampati: II  TM Distance: >3 FB Neck ROM: Full    Dental  (+) Teeth Intact   Pulmonary neg shortness of breath, neg sleep apnea, neg COPD, neg recent URI, former smoker,    breath sounds clear to auscultation- rhonchi       Cardiovascular negative cardio ROS   Rhythm:Regular     Neuro/Psych negative neurological ROS  negative psych ROS   GI/Hepatic negative GI ROS, Neg liver ROS,   Endo/Other  Morbid obesity  Renal/GU negative Renal ROS     Musculoskeletal negative musculoskeletal ROS (+)   Abdominal   Peds  Hematology  (+) anemia ,   Anesthesia Other Findings   Reproductive/Obstetrics                             Anesthesia Physical Anesthesia Plan  ASA: II  Anesthesia Plan: General   Post-op Pain Management:    Induction: Intravenous  Airway Management Planned: Oral ETT  Additional Equipment: None  Intra-op Plan:   Post-operative Plan: Extubation in OR  Informed Consent: I have reviewed the patients History and Physical, chart, labs and discussed the procedure including the risks, benefits and alternatives for the proposed anesthesia with the patient or authorized representative who has indicated his/her understanding and acceptance.   Dental advisory given  Plan Discussed with: CRNA and Surgeon  Anesthesia Plan Comments:         Anesthesia Quick Evaluation

## 2014-11-17 NOTE — Transfer of Care (Signed)
Immediate Anesthesia Transfer of Care Note  Patient: Diana Peterson  Procedure(s) Performed: Procedure(s): LAPAROSCOPIC BILATERAL SALPINGECTOMY (Bilateral)  Patient Location: PACU  Anesthesia Type:General  Level of Consciousness: awake, alert  and oriented  Airway & Oxygen Therapy: Patient Spontanous Breathing and Patient connected to nasal cannula oxygen  Post-op Assessment: Report given to RN and Post -op Vital signs reviewed and stable  Post vital signs: Reviewed and stable  Last Vitals:  Filed Vitals:   11/17/14 1338  BP: 124/69  Pulse: 71  Temp: 36.8 C  Resp: 16    Complications: No apparent anesthesia complications

## 2014-11-17 NOTE — Anesthesia Postprocedure Evaluation (Signed)
  Anesthesia Post-op Note  Patient: Diana Peterson  Procedure(s) Performed: Procedure(s): LAPAROSCOPIC BILATERAL SALPINGECTOMY (Bilateral) Patient is awake and responsive. Pain and nausea are reasonably well controlled. Vital signs are stable and clinically acceptable. Oxygen saturation is clinically acceptable. There are no apparent anesthetic complications at this time. Patient is ready for discharge.

## 2014-11-17 NOTE — Interval H&P Note (Signed)
History and Physical Interval Note:  11/17/2014 1:36 PM  Diana Peterson  has presented today for surgery, with the diagnosis of Desire for Surgical Sterilzation  The various methods of treatment have been discussed with the patient and family. After consideration of risks, benefits and other options for treatment, the patient has consented to  Procedure(s): LAPAROSCOPIC BILATERAL SALPINGECTOMY (Bilateral) as a surgical intervention .  The patient's history has been reviewed, patient examined, no change in status, stable for surgery.  I have reviewed the patient's chart and labs.  Questions were answered to the patient's satisfaction.     [facility]

## 2014-11-17 NOTE — Brief Op Note (Signed)
11/17/2014  4:24 PM  PATIENT:  Diana Peterson  34 y.o. female  PRE-OPERATIVE DIAGNOSIS:  Desire for Surgical Sterilzation  POST-OPERATIVE DIAGNOSIS:  Desire for Surgical Sterilzation  PROCEDURE:  Procedure(s): LAPAROSCOPIC BILATERAL SALPINGECTOMY (Bilateral)  SURGEON:  Surgeon(s) and Role:    * Hoover Browns, MD - Primary  PHYSICIAN ASSISTANT:   ASSISTANTS: Tracie, Scrub Technician   ANESTHESIA:   general  EBL:  Total I/O In: 1800 [I.V.:1800] Out: 620 [Urine:600; Blood:20]  BLOOD ADMINISTERED:none  DRAINS: none   LOCAL MEDICATIONS USED:  MARCAINE WITH EPINEPHRINE  SPECIMEN:  Source of Specimen:  LEFT AND RIGHT FALLOPIAN TUBES  DISPOSITION OF SPECIMEN:  PATHOLOGY  COUNTS:  YES  TOURNIQUET:  * No tourniquets in log *  DICTATION: .Dragon Dictation  PLAN OF CARE: Discharge to home after PACU  PATIENT DISPOSITION:  PACU - hemodynamically stable.   Delay start of Pharmacological VTE agent (>24hrs) due to surgical blood loss or risk of bleeding: not applicable

## 2014-11-19 ENCOUNTER — Encounter (HOSPITAL_COMMUNITY): Payer: Self-pay | Admitting: Obstetrics & Gynecology

## 2014-11-21 LAB — HERPES SIMPLEX VIRUS CULTURE
Culture: NOT DETECTED
SPECIAL REQUESTS: NORMAL

## 2015-12-17 ENCOUNTER — Ambulatory Visit (HOSPITAL_COMMUNITY)
Admission: EM | Admit: 2015-12-17 | Discharge: 2015-12-17 | Disposition: A | Discharge status: Home / Self Care | Payer: Medicaid Other | Attending: Internal Medicine | Admitting: Internal Medicine

## 2015-12-17 ENCOUNTER — Encounter (HOSPITAL_COMMUNITY): Payer: Self-pay | Admitting: Emergency Medicine

## 2015-12-17 DIAGNOSIS — J069 Acute upper respiratory infection, unspecified: Secondary | ICD-10-CM

## 2015-12-17 DIAGNOSIS — L0591 Pilonidal cyst without abscess: Secondary | ICD-10-CM

## 2015-12-17 DIAGNOSIS — R062 Wheezing: Secondary | ICD-10-CM

## 2015-12-17 DIAGNOSIS — L0592 Pilonidal sinus without abscess: Secondary | ICD-10-CM

## 2015-12-17 MED ORDER — ALBUTEROL SULFATE HFA 108 (90 BASE) MCG/ACT IN AERS: 2.0000 | INHALATION_SPRAY | RESPIRATORY_TRACT | 2 refills | Status: DC | PRN

## 2015-12-17 MED ORDER — ALBUTEROL SULFATE (2.5 MG/3ML) 0.083% IN NEBU
5.0000 mg | INHALATION_SOLUTION | Freq: Once | RESPIRATORY_TRACT | Status: AC
  Administered 2015-12-17: 5 mg via RESPIRATORY_TRACT

## 2015-12-17 MED ORDER — ALBUTEROL SULFATE (2.5 MG/3ML) 0.083% IN NEBU
INHALATION_SOLUTION | RESPIRATORY_TRACT | Status: AC
  Filled 2015-12-17: qty 6

## 2015-12-17 MED ORDER — CLINDAMYCIN HCL 300 MG PO CAPS: 300.0000 mg | ORAL_CAPSULE | Freq: Three times a day (TID) | ORAL | 0 refills | Status: DC

## 2015-12-17 NOTE — ED Triage Notes (Signed)
[facility].  The patient reported a cough with chest congestion x 1 week. The patient reported the cough as productive and it has increased shortness of breath over the last day.   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 She requested it to be checked for infection.

## 2015-12-17 NOTE — ED Provider Notes (Signed)
CSN: [id number]     Arrival date & time 12/17/15  1703 History   First MD Initiated Contact with Patient 12/17/15 1913     Chief Complaint  Patient presents with  . Cough  . Cyst   (Consider location/radiation/quality/duration/timing/severity/associated sxs/prior Treatment) HPI NPX2 COUGH AND COLD SXS. NOW WITH WHEEZE. UNABLE TO LIE DOWN WITHOUT BRONCHOSPASM.  ALSO HAS HAD DRAINAGE FROM A PILONIDAL SINUS. NO PUS . STATES IT DRAINS ON OCCASION. DOES NOT WANT TO REPEAT INFECTION. PRESENT FOR THE LAST 2 DAYS.  Past Medical History:  Diagnosis Date  . Anemia   . Asthma   . History of gestational hypertension   . Hx of chlamydia infection   . Hx of varicella    Past Surgical History:  Procedure Laterality Date  . COCCYX FRACTURE SURGERY     2002 and 2003  . LAPAROSCOPIC BILATERAL SALPINGECTOMY Bilateral 11/17/2014   Procedure: LAPAROSCOPIC BILATERAL SALPINGECTOMY;  Surgeon: Hoover BrownsEma Kulwa, MD;  Location: [facility];  Service: Gynecology;  Laterality: Bilateral;   Family History  Problem Relation Age of Onset  . Hyperlipidemia Father   . Hypertension Father   . Diabetes Paternal Aunt   . Diabetes Paternal Uncle   . Diabetes Paternal Grandmother   . Birth defects Neg Hx   . Asthma Neg Hx   . Arthritis Neg Hx   . Alcohol abuse Neg Hx   . Cancer Neg Hx   . COPD Neg Hx   . Depression Neg Hx   . Drug abuse Neg Hx   . Early death Neg Hx   . Hearing loss Neg Hx   . Heart disease Neg Hx   . Kidney disease Neg Hx   . Learning disabilities Neg Hx   . Mental illness Neg Hx   . Mental retardation Neg Hx   . Miscarriages / Stillbirths Neg Hx   . Stroke Neg Hx   . Vision loss Neg Hx   . Varicose Veins Neg Hx    Social History  Substance Use Topics  . Smoking status: Former Smoker    Packs/day: 0.25    Quit date: 11/07/2013  . Smokeless tobacco: Never Used  . Alcohol use No   OB History    Gravida Para Term Preterm AB Living   3 2 2   1 2    SAB TAB Ectopic Multiple Live Births   1      0 2     Review of Systems  Denies: HEADACHE, NAUSEA, ABDOMINAL PAIN, CHEST PAIN, CONGESTION, DYSURIA, SHORTNESS OF BREATH  Allergies  Review of patient's allergies indicates no known allergies.  Home Medications   Prior to Admission medications   Medication Sig Start Date End Date Taking? Authorizing Provider  MULTIPLE VITAMIN PO Take by mouth.   Yes Historical Provider, MD  ferrous sulfate 325 (65 FE) MG EC tablet Take 1 tablet (325 mg total) by mouth 2 (two) times daily. Patient not taking: Reported on 11/17/2014 08/14/14 08/14/15  Venus Standard, CNM  ibuprofen (ADVIL,MOTRIN) 600 MG tablet Take 1 tablet (600 mg total) by mouth every 6 (six) hours as needed. 11/17/14   Hoover BrownsEma Kulwa, MD  oxyCODONE-acetaminophen (ROXICET) 5-325 MG tablet Take 1 tablet by mouth every 4 (four) hours as needed for moderate pain or severe pain. 11/17/14   Hoover BrownsEma Kulwa, MD  Prenatal Vit-Fe Fumarate-FA (PRENATAL MULTIVITAMIN) TABS tablet Take 1 tablet by mouth daily at 12 noon.    Historical Provider, MD   Meds Ordered and Administered this Visit  Medications  albuterol (PROVENTIL) (2.5 MG/3ML) 0.083% nebulizer solution 5 mg (not administered)    BP 115/66 (BP Location: Left Arm)   Pulse 104   Temp 99.2 F (37.3 C) (Oral)   Resp 18   LMP 12/08/2015 (Exact Date)   SpO2 100%  No data found.   Physical Exam NURSES NOTES AND VITAL SIGNS REVIEWED. CONSTITUTIONAL: Well developed, well nourished, no acute distress HEENT: normocephalic, atraumatic EYES: Conjunctiva normal NECK:normal ROM, supple, no adenopathy PULMONARY:No respiratory distress, normal effort DIFFUSE WHEEZING.  ABDOMINAL: Soft, ND, NT BS+, No CVAT, THERE IS AN OPEN PILONIDAL SINUS WITHOUT PUS DRAINAGE. NO INDURATION OR FLUCTUANCE.  MUSCULOSKELETAL: Normal ROM of all extremities,  SKIN: warm and dry without rash PSYCHIATRIC: Mood and affect, behavior are normal  Urgent Care Course   Clinical Course   NEBULIZER FOR WHEEZING Procedures  (including critical care time)  Labs Review Labs Reviewed - No data to display  Imaging Review No results found.   Visual Acuity Review  Right Eye Distance:   Left Eye Distance:   Bilateral Distance:    Right Eye Near:   Left Eye Near:    Bilateral Near:         MDM   1. Acute upper respiratory infection   2. Wheezes   3. Pilonidal sinus     Patient is reassured that there are no issues that require transfer to higher level of care at this time or additional tests. Patient is advised to continue home symptomatic treatment. Patient is advised that if there are new or worsening symptoms to attend the emergency department, contact primary care provider, or return to UC. Instructions of care provided discharged home in stable condition.    THIS NOTE WAS GENERATED USING A VOICE RECOGNITION SOFTWARE PROGRAM. ALL REASONABLE EFFORTS  WERE MADE TO PROOFREAD THIS DOCUMENT FOR ACCURACY.  I have verbally reviewed the discharge instructions with the patient. A printed AVS was given to the patient.  All questions were answered prior to discharge.      Tharon AquasFrank C Patrick, PA 12/17/15 2014

## 2021-01-18 ENCOUNTER — Other Ambulatory Visit: Payer: Self-pay

## 2021-01-18 ENCOUNTER — Ambulatory Visit
Admission: EM | Admit: 2021-01-18 | Discharge: 2021-01-18 | Disposition: A | Discharge status: Home / Self Care | Payer: BC Managed Care – PPO | Attending: Physician Assistant | Admitting: Physician Assistant

## 2021-01-18 DIAGNOSIS — L732 Hidradenitis suppurativa: Secondary | ICD-10-CM

## 2021-01-18 DIAGNOSIS — L0291 Cutaneous abscess, unspecified: Secondary | ICD-10-CM

## 2021-01-18 MED ORDER — DOXYCYCLINE HYCLATE 100 MG PO CAPS: 100.0000 mg | ORAL_CAPSULE | Freq: Two times a day (BID) | ORAL | 0 refills | Status: DC

## 2021-01-18 NOTE — ED Triage Notes (Signed)
Pt c/o abscess to rt breast and bilateral groin area x3wks. States tender to touch, hard, and no drainage.

## 2021-01-19 NOTE — ED Provider Notes (Signed)
[facility]    CSN: [id number] Arrival date & time: 01/18/21  1901      History   Chief Complaint Chief Complaint  Patient presents with   Abscess    HPI Diana Peterson is a 40 y.o. female.   Patient here today for evaluation of multiple abscesses that she has had for quite some time.  She states that it would seem that abscesses are forming tunnels.  She states that she will have 1 abscess improve but then underneath another abscess will develop.  She was diagnosed with hidradenitis suppurativa when she was 40 years old and states she feels with age her symptoms are worsening.  She does have some pain to current abscess that she notes to bilateral breasts as well as bilateral groin area.  She has not had fever but overall states she has felt fatigued.  The history is provided by the patient.  Abscess Associated symptoms: fatigue   Associated symptoms: no fever, no nausea and no vomiting    Past Medical History:  Diagnosis Date   Anemia    Asthma    History of gestational hypertension    Hx of chlamydia infection    Hx of varicella     Patient Active Problem List   Diagnosis Date Noted   Active labor at term 08/13/2014   SVD (spontaneous vaginal delivery) 08/13/2014   Obstetric labial laceration, delivered, current hospitalization 08/13/2014    Past Surgical History:  Procedure Laterality Date   COCCYX FRACTURE SURGERY     2002 and 2003   LAPAROSCOPIC BILATERAL SALPINGECTOMY Bilateral 11/17/2014   Procedure: LAPAROSCOPIC BILATERAL SALPINGECTOMY;  Surgeon: Hoover Browns, MD;  Location: [facility];  Service: Gynecology;  Laterality: Bilateral;    OB History     Gravida  3   Para  2   Term  2   Preterm      AB  1   Living  2      SAB  1   IAB      Ectopic      Multiple  0   Live Births  2            Home Medications    Prior to Admission medications   Medication Sig Start Date End Date Taking? Authorizing Provider   doxycycline (VIBRAMYCIN) 100 MG capsule Take 1 capsule (100 mg total) by mouth 2 (two) times daily. 01/18/21  Yes Tomi Bamberger, PA-C  ibuprofen (ADVIL,MOTRIN) 600 MG tablet Take 1 tablet (600 mg total) by mouth every 6 (six) hours as needed. 11/17/14   Hoover Browns, MD  MULTIPLE VITAMIN PO Take by mouth.    [provider]    Family History Family History  Problem Relation Age of Onset   Hyperlipidemia Father    Hypertension Father    Diabetes Paternal Aunt    Diabetes Paternal Uncle    Diabetes Paternal Grandmother    Birth defects Neg Hx    Asthma Neg Hx    Arthritis Neg Hx    Alcohol abuse Neg Hx    Cancer Neg Hx    COPD Neg Hx    Depression Neg Hx    Drug abuse Neg Hx    Early death Neg Hx    Hearing loss Neg Hx    Heart disease Neg Hx    Kidney disease Neg Hx    Learning disabilities Neg Hx    Mental illness Neg Hx    Mental retardation Neg  Hx    Miscarriages / Stillbirths Neg Hx    Stroke Neg Hx    Vision loss Neg Hx    Varicose Veins Neg Hx     Social History Social History   Tobacco Use   Smoking status: Former    Packs/day: 0.25    Types: Cigarettes    Quit date: 11/07/2013    Years since quitting: 7.2   Smokeless tobacco: Never  Substance Use Topics   Alcohol use: No   Drug use: No     Allergies   Patient has no known allergies.   Review of Systems Review of Systems  Constitutional:  Positive for fatigue. Negative for chills and fever.  Eyes:  Negative for discharge and redness.  Respiratory:  Negative for shortness of breath.   Gastrointestinal:  Negative for abdominal pain, nausea and vomiting.  Genitourinary:  Positive for vaginal bleeding and vaginal discharge.  Skin:  Positive for color change and wound.    Physical Exam Triage Vital Signs ED Triage Vitals  Enc Vitals Group     BP 01/18/21 1941 119/80     Pulse Rate 01/18/21 1941 95     Resp 01/18/21 1941 18     Temp 01/18/21 1941 98.1 F (36.7 C)     Temp Source  01/18/21 1941 Oral     SpO2 01/18/21 1941 98 %     Weight --      Height --      Head Circumference --      Peak Flow --      Pain Score 01/18/21 1942 7     Pain Loc --      Pain Edu? --      Excl. in GC? --    No data found.  Updated Vital Signs BP 119/80 (BP Location: Left Arm)   Pulse 95   Temp 98.1 F (36.7 C) (Oral)   Resp 18   LMP 01/04/2021   SpO2 98%   Breastfeeding No   Visual Acuity Right Eye Distance:   Left Eye Distance:   Bilateral Distance:    Right Eye Near:   Left Eye Near:    Bilateral Near:     Physical Exam Vitals and nursing note reviewed.  Constitutional:      General: She is not in acute distress.    Appearance: Normal appearance. She is not ill-appearing.  HENT:     Head: Normocephalic and atraumatic.  Eyes:     Conjunctiva/sclera: Conjunctivae normal.  Cardiovascular:     Rate and Rhythm: Normal rate.  Pulmonary:     Effort: Pulmonary effort is normal.  Skin:    Comments: Multiple abscesses noted to bilateral breast mainly to center chest, mild erythema, no active drainage, but appear they have been draining.  Neurological:     Mental Status: She is alert.  Psychiatric:        Mood and Affect: Mood normal.        Behavior: Behavior normal.        Thought Content: Thought content normal.     UC Treatments / Results  Labs (all labs ordered are listed, but only abnormal results are displayed) Labs Reviewed - No data to display  EKG   Radiology No results found.  Procedures Procedures (including critical care time)  Medications Ordered in UC Medications - No data to display  Initial Impression / Assessment and Plan / UC Course  I have reviewed the triage vital signs and the nursing notes.  Pertinent labs & imaging results that were available during my care of the patient were reviewed by me and considered in my medical decision making (see chart for details).     Antibiotic prescribed to cover abscesses.  Discussed  that patient will need follow-up with specialist, and she requests referral information for dermatology.  Contact information provided as requested.  Encouraged follow-up with any further concerns.   Final Clinical Impressions(s) / UC Diagnoses   Final diagnoses:  Abscess  Hidradenitis suppurativa   Discharge Instructions   None    ED Prescriptions     Medication Sig Dispense Auth. Provider   doxycycline (VIBRAMYCIN) 100 MG capsule Take 1 capsule (100 mg total) by mouth 2 (two) times daily. 20 capsule Tomi Bamberger, PA-C      PDMP not reviewed this encounter.   Tomi Bamberger, PA-C 01/19/21 [phone]

## 2022-01-30 ENCOUNTER — Encounter (HOSPITAL_COMMUNITY): Payer: Self-pay

## 2022-01-30 ENCOUNTER — Emergency Department (HOSPITAL_COMMUNITY): Payer: BC Managed Care – PPO

## 2022-01-30 ENCOUNTER — Other Ambulatory Visit: Payer: Self-pay

## 2022-01-30 ENCOUNTER — Emergency Department (HOSPITAL_COMMUNITY)
Admission: EM | Admit: 2022-01-30 | Discharge: 2022-01-30 | Disposition: A | Discharge status: Home / Self Care | Payer: BC Managed Care – PPO | Attending: Emergency Medicine | Admitting: Emergency Medicine

## 2022-01-30 DIAGNOSIS — N1330 Unspecified hydronephrosis: Secondary | ICD-10-CM | POA: Diagnosis not present

## 2022-01-30 DIAGNOSIS — R630 Anorexia: Secondary | ICD-10-CM | POA: Diagnosis not present

## 2022-01-30 DIAGNOSIS — K529 Noninfective gastroenteritis and colitis, unspecified: Secondary | ICD-10-CM | POA: Insufficient documentation

## 2022-01-30 DIAGNOSIS — M5127 Other intervertebral disc displacement, lumbosacral region: Secondary | ICD-10-CM | POA: Diagnosis not present

## 2022-01-30 DIAGNOSIS — K6389 Other specified diseases of intestine: Secondary | ICD-10-CM | POA: Diagnosis not present

## 2022-01-30 DIAGNOSIS — R109 Unspecified abdominal pain: Secondary | ICD-10-CM | POA: Diagnosis not present

## 2022-01-30 LAB — URINALYSIS, ROUTINE W REFLEX MICROSCOPIC
Bacteria, UA: NONE SEEN
Bilirubin Urine: NEGATIVE
Glucose, UA: NEGATIVE mg/dL
Hgb urine dipstick: NEGATIVE
Ketones, ur: 20 mg/dL — AB
Nitrite: NEGATIVE
Protein, ur: NEGATIVE mg/dL
Specific Gravity, Urine: 1.026 (ref 1.005–1.030)
pH: 5 (ref 5.0–8.0)

## 2022-01-30 LAB — CBC WITH DIFFERENTIAL/PLATELET
Abs Immature Granulocytes: 0.02 10*3/uL (ref 0.00–0.07)
Basophils Absolute: 0 10*3/uL (ref 0.0–0.1)
Basophils Relative: 0 %
Eosinophils Absolute: 0 10*3/uL (ref 0.0–0.5)
Eosinophils Relative: 0 %
HCT: 36.1 % (ref 36.0–46.0)
Hemoglobin: 12.1 g/dL (ref 12.0–15.0)
Immature Granulocytes: 0 %
Lymphocytes Relative: 27 %
Lymphs Abs: 2.4 10*3/uL (ref 0.7–4.0)
MCH: 31.3 pg (ref 26.0–34.0)
MCHC: 33.5 g/dL (ref 30.0–36.0)
MCV: 93.3 fL (ref 80.0–100.0)
Monocytes Absolute: 0.7 10*3/uL (ref 0.1–1.0)
Monocytes Relative: 8 %
Neutro Abs: 5.8 10*3/uL (ref 1.7–7.7)
Neutrophils Relative %: 65 %
Platelets: 299 10*3/uL (ref 150–400)
RBC: 3.87 MIL/uL (ref 3.87–5.11)
RDW: 13.3 % (ref 11.5–15.5)
WBC: 9 10*3/uL (ref 4.0–10.5)
nRBC: 0 % (ref 0.0–0.2)

## 2022-01-30 LAB — COMPREHENSIVE METABOLIC PANEL
ALT: 14 U/L (ref 0–44)
AST: 20 U/L (ref 15–41)
Albumin: 4.3 g/dL (ref 3.5–5.0)
Alkaline Phosphatase: 51 U/L (ref 38–126)
Anion gap: 10 (ref 5–15)
BUN: 17 mg/dL (ref 6–20)
CO2: 24 mmol/L (ref 22–32)
Calcium: 9.4 mg/dL (ref 8.9–10.3)
Chloride: 106 mmol/L (ref 98–111)
Creatinine, Ser: 0.8 mg/dL (ref 0.44–1.00)
GFR, Estimated: >60 mL/min (ref >60)
Glucose, Bld: 99 mg/dL (ref 70–99)
Potassium: 4 mmol/L (ref 3.5–5.1)
Sodium: 140 mmol/L (ref 135–145)
Total Bilirubin: 0.5 mg/dL (ref 0.3–1.2)
Total Protein: 7.7 g/dL (ref 6.5–8.1)

## 2022-01-30 LAB — LIPASE, BLOOD: Lipase: 25 U/L (ref 11–51)

## 2022-01-30 MED ORDER — ONDANSETRON 4 MG PO TBDP: 4.0000 mg | ORAL_TABLET | Freq: Three times a day (TID) | ORAL | 0 refills | Status: DC | PRN

## 2022-01-30 MED ORDER — AMOXICILLIN-POT CLAVULANATE 875-125 MG PO TABS
1.0000 | ORAL_TABLET | Freq: Once | ORAL | Status: AC
  Administered 2022-01-30: 1 via ORAL
  Filled 2022-01-30: qty 1

## 2022-01-30 MED ORDER — AMOXICILLIN-POT CLAVULANATE 875-125 MG PO TABS: 1.0000 | ORAL_TABLET | Freq: Two times a day (BID) | ORAL | 0 refills | Status: DC

## 2022-01-30 MED ORDER — ONDANSETRON 4 MG PO TBDP
4.0000 mg | ORAL_TABLET | Freq: Once | ORAL | Status: AC
  Administered 2022-01-30: 4 mg via ORAL
  Filled 2022-01-30: qty 1

## 2022-01-30 NOTE — ED Triage Notes (Signed)
Pt reports with left flank pain, lower abdominal pain, and pelvic pressure x 2 days. Pt is also having nausea and loss of appetite.

## 2022-01-30 NOTE — ED Provider Notes (Signed)
[facility]   CSN: [id number] Arrival date & time: 01/30/22  0158     History  Chief Complaint  Patient presents with   Flank Pain    Diana Peterson is a 41 y.o. female.  Patient here with lower abdominal pain.  Symptoms for the last 2 to 3 days.  Has had some nausea vomiting diarrhea.  Denies any suspicious food intake.  Denies any vaginal bleeding or discharge.  Maybe some discomfort when she urinates.  Had tubal ligation.  Denies any chest pain or shortness of breath or fever or chills.  Denies any major abdominal surgery in the past otherwise.  The history is provided by the patient.       Home Medications Prior to Admission medications   Medication Sig Start Date End Date Taking? Authorizing Provider  amoxicillin-clavulanate (AUGMENTIN) 875-125 MG tablet Take 1 tablet by mouth every 12 (twelve) hours. 01/30/22  Yes Diana Bradway, DO  ondansetron (ZOFRAN-ODT) 4 MG disintegrating tablet Take 1 tablet (4 mg total) by mouth every 8 (eight) hours as needed for nausea or vomiting. 01/30/22  Yes Diana Fenley, DO  doxycycline (VIBRAMYCIN) 100 MG capsule Take 1 capsule (100 mg total) by mouth 2 (two) times daily. 01/18/21   Diana Bamberger, PA-C  ibuprofen (ADVIL,MOTRIN) 600 MG tablet Take 1 tablet (600 mg total) by mouth every 6 (six) hours as needed. 11/17/14   Diana Browns, MD  MULTIPLE VITAMIN PO Take by mouth.    [provider]      Allergies    Patient has no known allergies.    Review of Systems   Review of Systems  Physical Exam Updated Vital Signs BP 114/70   Pulse 66   Temp 97.8 F (36.6 C) (Oral)   Resp 18   Ht 5\' 1"  (1.549 m)   Wt 68 kg   SpO2 100%   BMI 28.34 kg/m  Physical Exam Vitals and nursing note reviewed.  Constitutional:      General: She is not in acute distress.    Appearance: She is well-developed. She is not ill-appearing.  HENT:     Head: Normocephalic and atraumatic.   Eyes:     Conjunctiva/sclera: Conjunctivae normal.     Pupils: Pupils are equal, round, and reactive to light.  Cardiovascular:     Rate and Rhythm: Normal rate and regular rhythm.     Pulses: Normal pulses.     Heart sounds: Normal heart sounds. No murmur heard. Pulmonary:     Effort: Pulmonary effort is normal. No respiratory distress.     Breath sounds: Normal breath sounds.  Abdominal:     Palpations: Abdomen is soft.     Tenderness: There is abdominal tenderness.  Musculoskeletal:        General: No swelling.     Cervical back: Normal range of motion and neck supple.  Skin:    General: Skin is warm and dry.     Capillary Refill: Capillary refill takes less than 2 seconds.  Neurological:     Mental Status: She is alert.  Psychiatric:        Mood and Affect: Mood normal.     ED Results / Procedures / Treatments   Labs (all labs ordered are listed, but only abnormal results are displayed) Labs Reviewed  URINALYSIS, ROUTINE W REFLEX MICROSCOPIC - Abnormal; Notable for the following components:      Result Value   APPearance HAZY (*)  Ketones, ur 20 (*)    Leukocytes,Ua TRACE (*)    All other components within normal limits  COMPREHENSIVE METABOLIC PANEL  LIPASE, BLOOD  CBC WITH DIFFERENTIAL/PLATELET    EKG None  Radiology CT Renal Stone Study  Result Date: 01/30/2022 CLINICAL DATA:  Left flank and lower abdominal pain for 2 days. Nausea and loss of appetite. EXAM: CT ABDOMEN AND PELVIS WITHOUT CONTRAST TECHNIQUE: Multidetector CT imaging of the abdomen and pelvis was performed following the standard protocol without IV contrast. RADIATION DOSE REDUCTION: This exam was performed according to the departmental dose-optimization program which includes automated exposure control, adjustment of the mA and/or kV according to patient size and/or use of iterative reconstruction technique. COMPARISON:  CT December 31, 2006. FINDINGS: Lower chest: No acute abnormality on  this motion degraded examination of the lung bases. Hepatobiliary: Unremarkable noncontrast enhanced appearance of the hepatic parenchyma. Gallbladder appears normal. No biliary ductal dilation. Pancreas: No pancreatic ductal dilation or evidence of acute inflammation. Spleen: No splenomegaly. Adrenals/Urinary Tract: Bilateral adrenal glands appear normal. Hydronephrosis. No renal, ureteral or bladder calculi. Urinary bladder is unremarkable for degree of distension. Stomach/Bowel: No radiopaque enteric contrast material was administered. Stomach is unremarkable for degree of distension. No pathologic dilation of large or small bowel. Wall thickening of the distal ileum and cecum with adjacent fat stranding. Vascular/Lymphatic: Normal caliber abdominal aorta. No pathologically enlarged abdominal or pelvic lymph nodes. Reproductive: Uterus is unremarkable.  No suspicious adnexal mass. Other: Small volume free fluid in the pelvis Musculoskeletal: L5-S1 discogenic disease. Degenerative change of the bilateral SI joints. IMPRESSION: 1. Wall thickening of the distal ileum and cecum with adjacent fat stranding, suggestive of an infectious or inflammatory enterocolitis. 2. Small volume free fluid in the pelvis, likely reactive. 3. No hydronephrosis. No renal, ureteral or bladder calculi. Electronically Signed   By: Diana Peterson M.D.   On: 01/30/2022 08:24    Procedures Procedures    Medications Ordered in ED Medications  amoxicillin-clavulanate (AUGMENTIN) 875-125 MG per tablet 1 tablet (has no administration in time range)  ondansetron (ZOFRAN-ODT) disintegrating tablet 4 mg (4 mg Oral Given 01/30/22 0750)    ED Course/ Medical Decision Making/ A&P                           Medical Decision Making Amount and/or Complexity of Data Reviewed Labs: ordered. Radiology: ordered.  Risk Prescription drug management.   Diana Peterson is here with abdominal pain.  No significant comorbidities.   Normal vitals.  No fever.  Differential diagnosis includes urinary tract infection, colitis, viral illness, less likely bowel obstruction.  Will get CBC, CMP, lipase, urinalysis and CT scan abdomen pelvis.  Will give Zofran.  Per my review and interpretation of labs is no significant anemia, electrolyte abnormality or kidney injury or leukocytosis.  CT scan possibly with enterocolitis.  Will treat with Augmentin.  Although suspect viral process.  Will support symptoms with Zofran for nausea.  Discharged in good condition.  Understands return precautions.  This chart was dictated using voice recognition software.  Despite best efforts to proofread,  errors can occur which can change the documentation meaning.         Final Clinical Impression(s) / ED Diagnoses Final diagnoses:  Colitis    Rx / DC Orders ED Discharge Orders          Ordered    amoxicillin-clavulanate (AUGMENTIN) 875-125 MG tablet  Every 12 hours  01/30/22 0837    ondansetron (ZOFRAN-ODT) 4 MG disintegrating tablet  Every 8 hours PRN        01/30/22 0837              Virgina Norfolk, DO 01/30/22 [phone]

## 2022-01-30 NOTE — ED Notes (Signed)
Error in discharge paperwork. New AVS printed with medications and prescriptions provided.

## 2022-02-06 DIAGNOSIS — Z79899 Other long term (current) drug therapy: Secondary | ICD-10-CM | POA: Diagnosis not present

## 2022-02-06 DIAGNOSIS — L732 Hidradenitis suppurativa: Secondary | ICD-10-CM | POA: Diagnosis not present

## 2022-02-06 DIAGNOSIS — L089 Local infection of the skin and subcutaneous tissue, unspecified: Secondary | ICD-10-CM | POA: Diagnosis not present

## 2022-02-06 DIAGNOSIS — E663 Overweight: Secondary | ICD-10-CM | POA: Diagnosis not present

## 2022-04-30 ENCOUNTER — Ambulatory Visit
Admission: EM | Admit: 2022-04-30 | Discharge: 2022-04-30 | Disposition: A | Discharge status: Home / Self Care | Payer: BC Managed Care – PPO | Attending: Family Medicine | Admitting: Family Medicine

## 2022-04-30 DIAGNOSIS — R631 Polydipsia: Secondary | ICD-10-CM

## 2022-04-30 DIAGNOSIS — R5383 Other fatigue: Secondary | ICD-10-CM

## 2022-04-30 DIAGNOSIS — L309 Dermatitis, unspecified: Secondary | ICD-10-CM

## 2022-04-30 DIAGNOSIS — R6889 Other general symptoms and signs: Secondary | ICD-10-CM

## 2022-04-30 LAB — POCT FASTING CBG KUC MANUAL ENTRY: POCT Glucose (KUC): 106 mg/dL — AB (ref 70–99)

## 2022-04-30 MED ORDER — TRIAMCINOLONE ACETONIDE 0.1 % EX CREA: 1.0000 | TOPICAL_CREAM | Freq: Two times a day (BID) | CUTANEOUS | 0 refills | Status: AC

## 2022-04-30 NOTE — ED Provider Notes (Signed)
[name]    CSN: [id number] Arrival date & time: 04/30/22  1440      History   Chief Complaint Chief Complaint  Patient presents with   Thirst   Extremity Swelling   Fatigue    HPI Diana Peterson is a 42 y.o. female.   HPI  here for polydipsia and appetite changes and swelling.  In the last 2 to 3 months she has noticed herself drinking a lot of water and she is thirsty all the time.  She also can gain 20 or 30 pounds and then lose 20 or 30 pounds very quickly.  She notes a rash on her feet that is not improving with antifungal cream.  No fever or chills and no nausea or vomiting.  LMP 2/26  Past Medical History:  Diagnosis Date   Anemia    Asthma    History of gestational hypertension    Hx of chlamydia infection    Hx of varicella     Patient Active Problem List   Diagnosis Date Noted   Active labor at term 08/13/2014   SVD (spontaneous vaginal delivery) 08/13/2014   Obstetric labial laceration, delivered, current hospitalization 08/13/2014    Past Surgical History:  Procedure Laterality Date   COCCYX FRACTURE SURGERY     2002 and 2003   LAPAROSCOPIC BILATERAL SALPINGECTOMY Bilateral 11/17/2014   Procedure: LAPAROSCOPIC BILATERAL SALPINGECTOMY;  Surgeon: Waymon Amato, MD;  Location: [facility];  Service: Gynecology;  Laterality: Bilateral;    OB History     Gravida  3   Para  2   Term  2   Preterm      AB  1   Living  2      SAB  1   IAB      Ectopic      Multiple  0   Live Births  2            Home Medications    Prior to Admission medications   Medication Sig Start Date End Date Taking? Authorizing Provider  triamcinolone cream (KENALOG) 0.1 % Apply 1 Application topically 2 (two) times daily. To affected area till better 04/30/22  Yes Morrie Daywalt, Gwenlyn Perking, MD  MULTIPLE VITAMIN PO Take by mouth.    [provider]    Family History Family History  Problem Relation Age of Onset   Hyperlipidemia  Father    Hypertension Father    Diabetes Paternal Aunt    Diabetes Paternal Uncle    Diabetes Paternal Grandmother    Birth defects Neg Hx    Asthma Neg Hx    Arthritis Neg Hx    Alcohol abuse Neg Hx    Cancer Neg Hx    COPD Neg Hx    Depression Neg Hx    Drug abuse Neg Hx    Early death Neg Hx    Hearing loss Neg Hx    Heart disease Neg Hx    Kidney disease Neg Hx    Learning disabilities Neg Hx    Mental illness Neg Hx    Mental retardation Neg Hx    Miscarriages / Stillbirths Neg Hx    Stroke Neg Hx    Vision loss Neg Hx    Varicose Veins Neg Hx     Social History Social History   Tobacco Use   Smoking status: Former    Packs/day: 0.25    Types: Cigarettes    Quit date: 11/07/2013    Years  since quitting: 8.4   Smokeless tobacco: Never  Substance Use Topics   Alcohol use: No   Drug use: No     Allergies   Patient has no known allergies.   Review of Systems Review of Systems   Physical Exam Triage Vital Signs ED Triage Vitals  Enc Vitals Group     BP 04/30/22 1547 138/84     Pulse Rate 04/30/22 1547 84     Resp 04/30/22 1547 17     Temp 04/30/22 1547 98.6 F (37 C)     Temp Source 04/30/22 1547 Oral     SpO2 04/30/22 1547 97 %     Weight --      Height --      Head Circumference --      Peak Flow --      Pain Score 04/30/22 1551 0     Pain Loc --      Pain Edu? --      Excl. in [place]? --    No data found.  Updated Vital Signs BP 138/84 (BP Location: Left Arm)   Pulse 84   Temp 98.6 F (37 C) (Oral)   Resp 17   LMP 04/15/2022   SpO2 97%   Visual Acuity Right Eye Distance:   Left Eye Distance:   Bilateral Distance:    Right Eye Near:   Left Eye Near:    Bilateral Near:     Physical Exam Vitals reviewed.  Constitutional:      General: She is not in acute distress.    Appearance: She is not ill-appearing, toxic-appearing or diaphoretic.  HENT:     Mouth/Throat:     Mouth: Mucous membranes are moist.  Eyes:     Extraocular  Movements: Extraocular movements intact.     Pupils: Pupils are equal, round, and reactive to light.  Neck:     Comments: No thyromegaly Cardiovascular:     Rate and Rhythm: Normal rate and regular rhythm.     Heart sounds: No murmur heard. Pulmonary:     Effort: Pulmonary effort is normal.     Breath sounds: Normal breath sounds. No stridor. No wheezing, rhonchi or rales.  Abdominal:     Palpations: Abdomen is soft.     Tenderness: There is no abdominal tenderness.  Musculoskeletal:     Cervical back: Neck supple.  Lymphadenopathy:     Cervical: No cervical adenopathy.  Skin:    Capillary Refill: Capillary refill takes less than 2 seconds.     Findings: Rash (There is confluent mildly erythematous rash on the sole of the left foot and on the heel.) present.  Neurological:     General: No focal deficit present.     Mental Status: She is alert.  Psychiatric:        Behavior: Behavior normal.      UC Treatments / Results  Labs (all labs ordered are listed, but only abnormal results are displayed) Labs Reviewed  POCT FASTING CBG [facility] MANUAL ENTRY - Abnormal; Notable for the following components:      Result Value   POCT Glucose ([facility]) 106 (*)    All other components within normal limits  CBC  COMPREHENSIVE METABOLIC PANEL  TSH    EKG   Radiology No results found.  Procedures Procedures (including critical care time)  Medications Ordered in UC Medications - No data to display  Initial Impression / Assessment and Plan / UC Course  I have reviewed the triage vital  signs and the nursing notes.  Pertinent labs & imaging results that were available during my care of the patient were reviewed by me and considered in my medical decision making (see chart for details).        Fingerstick glucose is 106. BMP, CBC, and TSH are drawn today.  If helped her make an appointment for primary care but that will be in May.  She is given contact information for dermatology and  steroid cream is sent in.  Will notify her if there is anything significantly abnormal on the blood work. Final Clinical Impressions(s) / UC Diagnoses   Final diagnoses:  Fatigue, unspecified type  Polydipsia  Fluctuation of weight  Dermatitis     Discharge Instructions      Your sugar was 106 which is okay  We are drawing blood to check your blood counts, your electrolytes and kidney and liver function, and your thyroid function.  Staff will notify you if there is anything that needs treatment  Apply triamcinolone to the rash twice daily until better or up to 2 weeks.  Call about your primary care appointment sometime this week just to confirm the details.     ED Prescriptions     Medication Sig Dispense Auth. Provider   triamcinolone cream (KENALOG) 0.1 % Apply 1 Application topically 2 (two) times daily. To affected area till better 80 g Windy Carina Gwenlyn Perking, MD      PDMP not reviewed this encounter.   Barrett Henle, MD 04/30/22 [phone]

## 2022-04-30 NOTE — Discharge Instructions (Addendum)
Your sugar was 106 which is okay  We are drawing blood to check your blood counts, your electrolytes and kidney and liver function, and your thyroid function.  Staff will notify you if there is anything that needs treatment  Apply triamcinolone to the rash twice daily until better or up to 2 weeks.  Call about your primary care appointment sometime this week just to confirm the details.

## 2022-04-30 NOTE — ED Triage Notes (Signed)
Pt presents with complaints of chronic hand & feet swelling, thirst, and fatigue for past few months.

## 2022-05-01 LAB — CBC
Hematocrit: 34.8 % (ref 34.0–46.6)
Hemoglobin: 11.6 g/dL (ref 11.1–15.9)
MCH: 31.2 pg (ref 26.6–33.0)
MCHC: 33.3 g/dL (ref 31.5–35.7)
MCV: 94 fL (ref 79–97)
Platelets: 295 10*3/uL (ref 150–450)
RBC: 3.72 x10E6/uL — ABNORMAL LOW (ref 3.77–5.28)
RDW: 13.3 % (ref 11.7–15.4)
WBC: 5.1 10*3/uL (ref 3.4–10.8)

## 2022-05-01 LAB — COMPREHENSIVE METABOLIC PANEL
ALT: 13 IU/L (ref 0–32)
AST: 21 IU/L (ref 0–40)
Albumin/Globulin Ratio: 1.6 (ref 1.2–2.2)
Albumin: 4.4 g/dL (ref 3.9–4.9)
Alkaline Phosphatase: 61 IU/L (ref 44–121)
BUN/Creatinine Ratio: 14 (ref 9–23)
BUN: 10 mg/dL (ref 6–24)
Bilirubin Total: 0.2 mg/dL (ref 0.0–1.2)
CO2: 23 mmol/L (ref 20–29)
Calcium: 9.2 mg/dL (ref 8.7–10.2)
Chloride: 103 mmol/L (ref 96–106)
Creatinine, Ser: 0.72 mg/dL (ref 0.57–1.00)
Globulin, Total: 2.8 g/dL (ref 1.5–4.5)
Glucose: 92 mg/dL (ref 70–99)
Potassium: 4.1 mmol/L (ref 3.5–5.2)
Sodium: 142 mmol/L (ref 134–144)
Total Protein: 7.2 g/dL (ref 6.0–8.5)
eGFR: 106 mL/min/{1.73_m2} (ref >59)

## 2022-05-01 LAB — TSH: TSH: 1.14 u[IU]/mL (ref 0.450–4.500)

## 2022-05-08 DIAGNOSIS — L089 Local infection of the skin and subcutaneous tissue, unspecified: Secondary | ICD-10-CM | POA: Diagnosis not present

## 2022-05-08 DIAGNOSIS — L304 Erythema intertrigo: Secondary | ICD-10-CM | POA: Diagnosis not present

## 2022-05-08 DIAGNOSIS — L732 Hidradenitis suppurativa: Secondary | ICD-10-CM | POA: Diagnosis not present

## 2023-09-05 ENCOUNTER — Encounter: Payer: Self-pay | Admitting: Advanced Practice Midwife

## 2023-10-15 ENCOUNTER — Encounter: Payer: Self-pay | Admitting: Emergency Medicine

## 2023-10-15 ENCOUNTER — Emergency Department (HOSPITAL_COMMUNITY)
Admission: EM | Admit: 2023-10-15 | Discharge: 2023-10-16 | Disposition: A | Discharge status: Home / Self Care | Attending: Emergency Medicine | Admitting: Emergency Medicine

## 2023-10-15 ENCOUNTER — Encounter (HOSPITAL_COMMUNITY): Payer: Self-pay | Admitting: Emergency Medicine

## 2023-10-15 ENCOUNTER — Other Ambulatory Visit: Payer: Self-pay

## 2023-10-15 ENCOUNTER — Ambulatory Visit: Admission: EM | Admit: 2023-10-15 | Discharge: 2023-10-15 | Disposition: A | Discharge status: Home / Self Care

## 2023-10-15 DIAGNOSIS — R0781 Pleurodynia: Secondary | ICD-10-CM | POA: Diagnosis not present

## 2023-10-15 DIAGNOSIS — O1490 Unspecified pre-eclampsia, unspecified trimester: Secondary | ICD-10-CM | POA: Insufficient documentation

## 2023-10-15 DIAGNOSIS — K828 Other specified diseases of gallbladder: Secondary | ICD-10-CM | POA: Diagnosis not present

## 2023-10-15 DIAGNOSIS — R1011 Right upper quadrant pain: Secondary | ICD-10-CM | POA: Insufficient documentation

## 2023-10-15 DIAGNOSIS — R109 Unspecified abdominal pain: Secondary | ICD-10-CM

## 2023-10-15 DIAGNOSIS — K649 Unspecified hemorrhoids: Secondary | ICD-10-CM | POA: Insufficient documentation

## 2023-10-15 DIAGNOSIS — R6883 Chills (without fever): Secondary | ICD-10-CM | POA: Insufficient documentation

## 2023-10-15 DIAGNOSIS — M545 Low back pain, unspecified: Secondary | ICD-10-CM | POA: Diagnosis not present

## 2023-10-15 DIAGNOSIS — R079 Chest pain, unspecified: Secondary | ICD-10-CM | POA: Diagnosis not present

## 2023-10-15 DIAGNOSIS — O139 Gestational [pregnancy-induced] hypertension without significant proteinuria, unspecified trimester: Secondary | ICD-10-CM | POA: Insufficient documentation

## 2023-10-15 LAB — COMPREHENSIVE METABOLIC PANEL WITH GFR
ALT: 12 U/L (ref 0–44)
AST: 18 U/L (ref 15–41)
Albumin: 4 g/dL (ref 3.5–5.0)
Alkaline Phosphatase: 70 U/L (ref 38–126)
Anion gap: 13 (ref 5–15)
BUN: 11 mg/dL (ref 6–20)
CO2: 23 mmol/L (ref 22–32)
Calcium: 9 mg/dL (ref 8.9–10.3)
Chloride: 102 mmol/L (ref 98–111)
Creatinine, Ser: 0.7 mg/dL (ref 0.44–1.00)
GFR, Estimated: >60 mL/min (ref >60)
Glucose, Bld: 84 mg/dL (ref 70–99)
Potassium: 3.5 mmol/L (ref 3.5–5.1)
Sodium: 138 mmol/L (ref 135–145)
Total Bilirubin: 0.3 mg/dL (ref 0.0–1.2)
Total Protein: 7.3 g/dL (ref 6.5–8.1)

## 2023-10-15 LAB — CBC
HCT: 34.4 % — ABNORMAL LOW (ref 36.0–46.0)
Hemoglobin: 11.5 g/dL — ABNORMAL LOW (ref 12.0–15.0)
MCH: 30.8 pg (ref 26.0–34.0)
MCHC: 33.4 g/dL (ref 30.0–36.0)
MCV: 92.2 fL (ref 80.0–100.0)
Platelets: 348 K/uL (ref 150–400)
RBC: 3.73 MIL/uL — ABNORMAL LOW (ref 3.87–5.11)
RDW: 13.2 % (ref 11.5–15.5)
WBC: 8.1 K/uL (ref 4.0–10.5)
nRBC: 0 % (ref 0.0–0.2)

## 2023-10-15 LAB — POCT URINE DIPSTICK
Bilirubin, UA: NEGATIVE
Glucose, UA: NEGATIVE mg/dL
Ketones, POC UA: NEGATIVE mg/dL
Leukocytes, UA: NEGATIVE
Nitrite, UA: NEGATIVE
Protein Ur, POC: NEGATIVE mg/dL
Spec Grav, UA: 1.02 (ref 1.010–1.025)
Urobilinogen, UA: 0.2 U/dL
pH, UA: 6 (ref 5.0–8.0)

## 2023-10-15 LAB — HCG, SERUM, QUALITATIVE: Preg, Serum: NEGATIVE

## 2023-10-15 LAB — LIPASE, BLOOD: Lipase: 16 U/L (ref 11–51)

## 2023-10-15 MED ORDER — CYCLOBENZAPRINE HCL 10 MG PO TABS: 10.0000 mg | ORAL_TABLET | Freq: Two times a day (BID) | ORAL | 0 refills | Status: AC | PRN

## 2023-10-15 NOTE — ED Provider Notes (Signed)
 [name]    CSN: [id number] Arrival date & time: 10/15/23  1014      History   Chief Complaint Chief Complaint  Patient presents with  . Flank Pain  . Back Pain    HPI Diana Peterson is a 43 y.o. female.    Flank Pain Pertinent negatives include no abdominal pain.  Back Pain Associated symptoms: no abdominal pain and no fever     Past Medical History:  Diagnosis Date  . Anemia   . Asthma   . History of gestational hypertension   . Hx of chlamydia infection   . Hx of varicella     Patient Active Problem List   Diagnosis Date Noted  . Hemorrhoids 10/15/2023  . Pre-eclampsia 10/15/2023  . Pregnancy-induced hypertension 10/15/2023  . Active labor at term 08/13/2014  . SVD (spontaneous vaginal delivery) 08/13/2014  . Obstetric labial laceration, delivered, current hospitalization 08/13/2014    Past Surgical History:  Procedure Laterality Date  . COCCYX FRACTURE SURGERY     2002 and 2003  . LAPAROSCOPIC BILATERAL SALPINGECTOMY Bilateral 11/17/2014   Procedure: LAPAROSCOPIC BILATERAL SALPINGECTOMY;  Surgeon: Jerolyn Foil, MD;  Location: [facility];  Service: Gynecology;  Laterality: Bilateral;    OB History     Gravida  3   Para  2   Term  2   Preterm      AB  1   Living  2      SAB  1   IAB      Ectopic      Multiple  0   Live Births  2            Home Medications    Prior to Admission medications   Medication Sig Start Date End Date Taking? Authorizing Provider  MULTIPLE VITAMIN PO Take by mouth.    [provider]  oxyCODONE -acetaminophen  (PERCOCET/ROXICET) 5-325 MG tablet TAKE 1 TABLET BY MOUTH EVERY 4 HOURS AS NEEDED FOR MODERATE PAIN OR SEVERE PAIN    [provider]  triamcinolone  cream (KENALOG ) 0.1 % Apply 1 Application topically 2 (two) times daily. To affected area till better 04/30/22   Vonna Sharlet POUR, MD    Family History Family History  Problem Relation Age of Onset  .  Hyperlipidemia Father   . Hypertension Father   . Diabetes Paternal Aunt   . Diabetes Paternal Uncle   . Diabetes Paternal Grandmother   . Birth defects Neg Hx   . Asthma Neg Hx   . Arthritis Neg Hx   . Alcohol abuse Neg Hx   . Cancer Neg Hx   . COPD Neg Hx   . Depression Neg Hx   . Drug abuse Neg Hx   . Early death Neg Hx   . Hearing loss Neg Hx   . Heart disease Neg Hx   . Kidney disease Neg Hx   . Learning disabilities Neg Hx   . Mental illness Neg Hx   . Mental retardation Neg Hx   . Miscarriages / Stillbirths Neg Hx   . Stroke Neg Hx   . Vision loss Neg Hx   . Varicose Veins Neg Hx     Social History Social History   Tobacco Use  . Smoking status: Former    Current packs/day: 0.00    Types: Cigarettes    Quit date: 11/07/2013    Years since quitting: 9.9    Passive exposure: Past  . Smokeless tobacco: Never  Vaping Use  .  Vaping status: Never Used  Substance Use Topics  . Alcohol use: No  . Drug use: No     Allergies   Patient has no known allergies.   Review of Systems Review of Systems  Constitutional:  Negative for chills and fever.  Eyes:  Negative for discharge and redness.  Gastrointestinal:  Negative for abdominal pain, nausea and vomiting.  Genitourinary:  Positive for flank pain.  Musculoskeletal:  Positive for back pain.     Physical Exam Triage Vital Signs ED Triage Vitals  Encounter Vitals Group     BP 10/15/23 1033 136/86     Girls Systolic BP Percentile --      Girls Diastolic BP Percentile --      Boys Systolic BP Percentile --      Boys Diastolic BP Percentile --      Pulse Rate 10/15/23 1033 88     Resp 10/15/23 1033 18     Temp 10/15/23 1033 98.8 F (37.1 C)     Temp Source 10/15/23 1033 Oral     SpO2 10/15/23 1033 98 %     Weight 10/15/23 1032 160 lb (72.6 kg)     Height --      Head Circumference --      Peak Flow --      Pain Score 10/15/23 1031 7     Pain Loc --      Pain Education --      Exclude from Growth  Chart --    No data found.  Updated Vital Signs BP 136/86 (BP Location: Left Arm)   Pulse 88   Temp 98.8 F (37.1 C) (Oral)   Resp 18   Wt 160 lb (72.6 kg)   LMP 09/29/2023 (Approximate)   SpO2 98%   BMI 30.23 kg/m   Visual Acuity Right Eye Distance:   Left Eye Distance:   Bilateral Distance:    Right Eye Near:   Left Eye Near:    Bilateral Near:     Physical Exam Vitals and nursing note reviewed.  Constitutional:      General: She is not in acute distress.    Appearance: Normal appearance. She is not ill-appearing.  HENT:     Head: Normocephalic and atraumatic.  Eyes:     Conjunctiva/sclera: Conjunctivae normal.  Cardiovascular:     Rate and Rhythm: Normal rate.  Pulmonary:     Effort: Pulmonary effort is normal.  Neurological:     Mental Status: She is alert.  Psychiatric:        Mood and Affect: Mood normal.        Behavior: Behavior normal.        Thought Content: Thought content normal.      UC Treatments / Results  Labs (all labs ordered are listed, but only abnormal results are displayed) Labs Reviewed  POCT URINE DIPSTICK    EKG   Radiology No results found.  Procedures Procedures (including critical care time)  Medications Ordered in UC Medications - No data to display  Initial Impression / Assessment and Plan / UC Course  I have reviewed the triage vital signs and the nursing notes.  Pertinent labs & imaging results that were available during my care of the patient were reviewed by me and considered in my medical decision making (see chart for details).     *** Final Clinical Impressions(s) / UC Diagnoses   Final diagnoses:  Flank pain   Discharge Instructions   None  ED Prescriptions   None    PDMP not reviewed this encounter.

## 2023-10-15 NOTE — ED Triage Notes (Signed)
 Pt presents c/o back and flank pain x 2 days. Pt reports she initially thought it was gas so she tried [place] but got no relief. She states she woke up with this pain on yesterday. Pt describes the pain is stuck mid back beneath her rib cage on right side. Pt states she has not had an appetite since the pain started.

## 2023-10-15 NOTE — ED Triage Notes (Signed)
 Patient c/o sharp flank pain x 1 day. Patient report taking PRN OTC medication and muscle relaxant from UC today without relief. Patient report nausea denies vomiting and diarrhea. Patient denies dysuria.  Patient report chills denies fever at home

## 2023-10-15 NOTE — ED Triage Notes (Signed)
 Pt not able to walk long distance. Moved from room 4 to room 1.

## 2023-10-16 ENCOUNTER — Encounter (HOSPITAL_COMMUNITY): Payer: Self-pay

## 2023-10-16 ENCOUNTER — Emergency Department (HOSPITAL_COMMUNITY)

## 2023-10-16 ENCOUNTER — Telehealth (HOSPITAL_COMMUNITY): Payer: Self-pay

## 2023-10-16 ENCOUNTER — Other Ambulatory Visit (HOSPITAL_COMMUNITY): Payer: Self-pay

## 2023-10-16 DIAGNOSIS — R109 Unspecified abdominal pain: Secondary | ICD-10-CM | POA: Diagnosis not present

## 2023-10-16 DIAGNOSIS — R079 Chest pain, unspecified: Secondary | ICD-10-CM | POA: Diagnosis not present

## 2023-10-16 DIAGNOSIS — I2699 Other pulmonary embolism without acute cor pulmonale: Secondary | ICD-10-CM | POA: Diagnosis not present

## 2023-10-16 DIAGNOSIS — J90 Pleural effusion, not elsewhere classified: Secondary | ICD-10-CM | POA: Diagnosis not present

## 2023-10-16 DIAGNOSIS — R7989 Other specified abnormal findings of blood chemistry: Secondary | ICD-10-CM | POA: Diagnosis not present

## 2023-10-16 DIAGNOSIS — K828 Other specified diseases of gallbladder: Secondary | ICD-10-CM | POA: Diagnosis not present

## 2023-10-16 DIAGNOSIS — R1011 Right upper quadrant pain: Secondary | ICD-10-CM | POA: Diagnosis not present

## 2023-10-16 LAB — CBC WITH DIFFERENTIAL/PLATELET
Abs Immature Granulocytes: 0.01 K/uL (ref 0.00–0.07)
Basophils Absolute: 0 K/uL (ref 0.0–0.1)
Basophils Relative: 0 %
Eosinophils Absolute: 0 K/uL (ref 0.0–0.5)
Eosinophils Relative: 1 %
HCT: 33.8 % — ABNORMAL LOW (ref 36.0–46.0)
Hemoglobin: 11 g/dL — ABNORMAL LOW (ref 12.0–15.0)
Immature Granulocytes: 0 %
Lymphocytes Relative: 36 %
Lymphs Abs: 2.5 K/uL (ref 0.7–4.0)
MCH: 30.3 pg (ref 26.0–34.0)
MCHC: 32.5 g/dL (ref 30.0–36.0)
MCV: 93.1 fL (ref 80.0–100.0)
Monocytes Absolute: 0.8 K/uL (ref 0.1–1.0)
Monocytes Relative: 11 %
Neutro Abs: 3.6 K/uL (ref 1.7–7.7)
Neutrophils Relative %: 52 %
Platelets: 307 K/uL (ref 150–400)
RBC: 3.63 MIL/uL — ABNORMAL LOW (ref 3.87–5.11)
RDW: 13.2 % (ref 11.5–15.5)
WBC: 6.9 K/uL (ref 4.0–10.5)
nRBC: 0 % (ref 0.0–0.2)

## 2023-10-16 LAB — D-DIMER, QUANTITATIVE: D-Dimer, Quant: 0.7 ug{FEU}/mL — ABNORMAL HIGH (ref 0.00–0.50)

## 2023-10-16 LAB — PREGNANCY, URINE: Preg Test, Ur: NEGATIVE

## 2023-10-16 LAB — TROPONIN T, HIGH SENSITIVITY: Troponin T High Sensitivity: <15 ng/L (ref 0–19)

## 2023-10-16 MED ORDER — FENTANYL CITRATE PF 50 MCG/ML IJ SOSY
50.0000 ug | PREFILLED_SYRINGE | Freq: Once | INTRAMUSCULAR | Status: AC
  Administered 2023-10-16: 50 ug via INTRAVENOUS
  Filled 2023-10-16: qty 1

## 2023-10-16 MED ORDER — LACTATED RINGERS IV BOLUS
1000.0000 mL | Freq: Once | INTRAVENOUS | Status: AC
  Administered 2023-10-16: 1000 mL via INTRAVENOUS

## 2023-10-16 MED ORDER — OXYCODONE-ACETAMINOPHEN 5-325 MG PO TABS
1.0000 | ORAL_TABLET | Freq: Four times a day (QID) | ORAL | 0 refills | Status: AC | PRN
  Filled 2023-10-16: qty 5, 2d supply, fill #0

## 2023-10-16 MED ORDER — IOHEXOL 350 MG/ML SOLN
100.0000 mL | Freq: Once | INTRAVENOUS | Status: AC | PRN
  Administered 2023-10-16: 100 mL via INTRAVENOUS

## 2023-10-16 MED ORDER — APIXABAN (ELIQUIS) VTE STARTER PACK (10MG AND 5MG)
ORAL_TABLET | ORAL | 0 refills | Status: DC
Start: 2023-10-16 — End: 2023-10-31
  Filled 2023-10-16: qty 74, 30d supply, fill #0

## 2023-10-16 MED ORDER — ONDANSETRON HCL 4 MG/2ML IJ SOLN
4.0000 mg | Freq: Once | INTRAMUSCULAR | Status: AC
  Administered 2023-10-16: 4 mg via INTRAVENOUS
  Filled 2023-10-16: qty 2

## 2023-10-16 MED ORDER — OXYCODONE HCL 5 MG PO TABS
5.0000 mg | ORAL_TABLET | Freq: Once | ORAL | Status: AC
  Administered 2023-10-16: 5 mg via ORAL
  Filled 2023-10-16: qty 1

## 2023-10-16 MED ORDER — APIXABAN 2.5 MG PO TABS
10.0000 mg | ORAL_TABLET | Freq: Once | ORAL | Status: AC
  Administered 2023-10-16: 10 mg via ORAL
  Filled 2023-10-16: qty 4

## 2023-10-16 NOTE — Telephone Encounter (Signed)
 Pharmacy Patient Advocate Encounter  Insurance verification completed.    The patient is insured through [employer]. Patient has [employer], may use a copay card, and/or apply for patient assistance if available.    Ran test claim for Eliquis  Starter Pack and the current 30 day co-pay is $0.00.   This test claim was processed through [facility]- copay amounts may vary at other pharmacies due to pharmacy/plan contracts, or as the patient moves through the different stages of their insurance plan.

## 2023-10-16 NOTE — Care Management (Signed)
 Patient will need PCP to monitor, PCP information placed on AVS

## 2023-10-16 NOTE — Discharge Instructions (Addendum)
 You were diagnosed with a small blood clot on the right side of your lung.  We treat this with a medication called Eliquis .  Is a blood thinning medication.  If you fall, accidentally cut yourself, or in a car accident, you are at a higher risk of bleeding.  Please be careful while taking this medication.  Including your discharge paperwork is the contact information for the [facility].  Please call them today to set up a follow-up appointment.  Information on my medicine - ELIQUIS  (apixaban )  This medication education was reviewed with me or my healthcare representative as part of my discharge preparation.    Why was Eliquis  prescribed for you? Eliquis  was prescribed to treat blood clots that may have been found in the veins of your legs (deep vein thrombosis) or in your lungs (pulmonary embolism) and to reduce the risk of them occurring again.  What do You need to know about Eliquis  ? The starting dose is 10 mg (two 5 mg tablets) taken TWICE daily for the FIRST SEVEN (7) DAYS, then on 10/23/23  the dose is reduced to ONE 5 mg tablet taken TWICE daily.  Eliquis  may be taken with or without food.   Try to take the dose about the same time in the morning and in the evening. If you have difficulty swallowing the tablet whole please discuss with your pharmacist how to take the medication safely.  Take Eliquis  exactly as prescribed and DO NOT stop taking Eliquis  without talking to the doctor who prescribed the medication.  Stopping may increase your risk of developing a new blood clot.  Refill your prescription before you run out.  After discharge, you should have regular check-up appointments with your healthcare provider that is prescribing your Eliquis .    What do you do if you miss a dose? If a dose of ELIQUIS  is not taken at the scheduled time, take it as soon as possible on the same day and twice-daily administration should be resumed. The dose should not be  doubled to make up for a missed dose.  Important Safety Information A possible side effect of Eliquis  is bleeding. You should call your healthcare provider right away if you experience any of the following: Bleeding from an injury or your nose that does not stop. Unusual colored urine (red or dark brown) or unusual colored stools (red or black). Unusual bruising for unknown reasons. A serious fall or if you hit your head (even if there is no bleeding).      Some medicines may interact with Eliquis  and might increase your risk of bleeding or clotting while on Eliquis . To help avoid this, consult your healthcare provider or pharmacist prior to using any new prescription or non-prescription medications, including herbals, vitamins, non-steroidal anti-inflammatory drugs (NSAIDs) and supplements.  This website has more information on Eliquis  (apixaban ): [email or web address] .[email or web address] dena

## 2023-10-16 NOTE — ED Provider Notes (Signed)
 [place] EMERGENCY DEPARTMENT AT [facility] Provider Note   CSN: [id number] Arrival date & time: 10/15/23  2125     Patient presents with: Flank Pain   Diana Peterson is a 43 y.o. female.   Patient is healthy.  Here with right sided abdominal pain underneath her rib cage.  Pain has been constant for the past few days progressively worsening.  Was seen in urgent care and given a prescription for Flexeril  but told to come to the ED if symptoms worsened.  The pain is underneath her right ribs worse with breathing and worse with palpation.  Nausea but no vomiting.  Poor appetite.  No fever.  No pain with urination or blood in the urine.  No chest pain or shortness of breath.  No cough or fever but does feel pain with breathing.  No pain with urination or blood in the urine.  She reports some chills but no fevers at home.  Still has gallbladder and appendix.  No birth control use.  No chest pain.  The history is provided by the patient.  Flank Pain Associated symptoms include abdominal pain. Pertinent negatives include no chest pain, no headaches and no shortness of breath.       Prior to Admission medications   Medication Sig Start Date End Date Taking? Authorizing Provider  cyclobenzaprine  (FLEXERIL ) 10 MG tablet Take 1 tablet (10 mg total) by mouth 2 (two) times daily as needed for muscle spasms. 10/15/23   Billy Asberry FALCON, PA-C  MULTIPLE VITAMIN PO Take by mouth.    [provider]  oxyCODONE -acetaminophen  (PERCOCET/ROXICET) 5-325 MG tablet TAKE 1 TABLET BY MOUTH EVERY 4 HOURS AS NEEDED FOR MODERATE PAIN OR SEVERE PAIN    [provider]  triamcinolone  cream (KENALOG ) 0.1 % Apply 1 Application topically 2 (two) times daily. To affected area till better 04/30/22   Vonna Sharlet POUR, MD    Allergies: Patient has no known allergies.    Review of Systems  Constitutional:  Negative for activity change, appetite change and fever.  HENT:  Negative for  congestion and rhinorrhea.   Respiratory:  Negative for cough, chest tightness and shortness of breath.   Cardiovascular:  Negative for chest pain.  Gastrointestinal:  Positive for abdominal pain and nausea. Negative for vomiting.  Genitourinary:  Positive for flank pain. Negative for dysuria.  Musculoskeletal:  Negative for arthralgias and myalgias.  Skin:  Negative for rash.  Neurological:  Negative for dizziness, weakness and headaches.   all other systems are negative except as noted in the HPI and PMH.    Updated Vital Signs BP 116/73 (BP Location: Left Arm)   Pulse 86   Temp 98.3 F (36.8 C) (Oral)   Resp 18   LMP 09/29/2023 (Approximate)   SpO2 99%   Physical Exam Vitals and nursing note reviewed.  Constitutional:      General: She is not in acute distress.    Appearance: She is well-developed.  HENT:     Head: Normocephalic and atraumatic.     Mouth/Throat:     Pharynx: No oropharyngeal exudate.  Eyes:     Conjunctiva/sclera: Conjunctivae normal.     Pupils: Pupils are equal, round, and reactive to light.  Neck:     Comments: No meningismus. Cardiovascular:     Rate and Rhythm: Normal rate and regular rhythm.     Heart sounds: Normal heart sounds. No murmur heard. Pulmonary:     Effort: Pulmonary effort is normal. No  respiratory distress.     Breath sounds: Normal breath sounds.  Abdominal:     Palpations: Abdomen is soft.     Tenderness: There is abdominal tenderness. There is no guarding or rebound.     Comments: Tender right upper quadrant, no guarding or rebound  Musculoskeletal:        General: No tenderness. Normal range of motion.     Cervical back: Normal range of motion and neck supple.     Comments: No CVAT  Skin:    General: Skin is warm.  Neurological:     Mental Status: She is alert and oriented to person, place, and time.     Cranial Nerves: No cranial nerve deficit.     Motor: No abnormal muscle tone.     Coordination: Coordination normal.      Comments:  5/5 strength throughout. CN 2-12 intact.Equal grip strength.   Psychiatric:        Behavior: Behavior normal.     (all labs ordered are listed, but only abnormal results are displayed) Labs Reviewed  CBC - Abnormal; Notable for the following components:      Result Value   RBC 3.73 (*)    Hemoglobin 11.5 (*)    HCT 34.4 (*)    All other components within normal limits  CBC WITH DIFFERENTIAL/PLATELET - Abnormal; Notable for the following components:   RBC 3.63 (*)    Hemoglobin 11.0 (*)    HCT 33.8 (*)    All other components within normal limits  D-DIMER, QUANTITATIVE - Abnormal; Notable for the following components:   D-Dimer, Quant 0.70 (*)    All other components within normal limits  COMPREHENSIVE METABOLIC PANEL WITH GFR  LIPASE, BLOOD  HCG, SERUM, QUALITATIVE  PREGNANCY, URINE  URINALYSIS, ROUTINE W REFLEX MICROSCOPIC  TROPONIN T, HIGH SENSITIVITY    EKG: None  Radiology: US  Abdomen Limited RUQ (LIVER/GB) Result Date: 10/16/2023 CLINICAL DATA:  Right upper quadrant pain EXAM: ULTRASOUND ABDOMEN LIMITED RIGHT UPPER QUADRANT COMPARISON:  01/30/2022 CT FINDINGS: Gallbladder: No gallstones or wall thickening visualized. Positive sonographic Murphy's sign is elicited. Common bile duct: Diameter: 4.6 mm Liver: No focal lesion identified. Within normal limits in parenchymal echogenicity. Portal vein is patent on color Doppler imaging with normal direction of blood flow towards the liver. Other: None. IMPRESSION: Well distended gallbladder without evidence of cholelithiasis. Positive sonographic Murphy's sign is elicited however. No other findings to suggest acute cholecystitis are noted. HIDA scan may be helpful for further evaluation. No other focal abnormality is noted. Electronically Signed   By: Oneil Devonshire M.D.   On: 10/16/2023 03:31   DG Chest 2 View Result Date: 10/16/2023 CLINICAL DATA:  Right-sided chest pain for 1 day, initial encounter EXAM: CHEST - 2  VIEW COMPARISON:  09/01/2006 FINDINGS: Cardiac shadow is within normal limits. The lungs are clear bilaterally. No bony abnormality is seen. IMPRESSION: No active cardiopulmonary disease. Electronically Signed   By: Oneil Devonshire M.D.   On: 10/16/2023 02:55     Procedures   Medications Ordered in the ED  fentaNYL  (SUBLIMAZE ) injection 50 mcg (has no administration in time range)  ondansetron  (ZOFRAN ) injection 4 mg (has no administration in time range)  lactated ringers  bolus 1,000 mL (has no administration in time range)                                    Medical Decision Making  Amount and/or Complexity of Data Reviewed Labs: ordered. Decision-making details documented in ED Course. Radiology: ordered and independent interpretation performed. Decision-making details documented in ED Course. ECG/medicine tests: ordered and independent interpretation performed. Decision-making details documented in ED Course.  Risk Prescription drug management.   Right upper quadrant pain x 1 day.   Stable vitals, no distress, abdomen soft but tender in the right upper quadrant.  Right upper quadrant pain reproducible on exam.  Labs are reassuring.  No leukocytosis.  LFTs and lipase are normal.  Chest x-ray shows no pneumonia.  Right upper quadrant ultrasound shows distended gallbladder but no gallstones.  Does have positive Murphy sign however. D-dimer negative with low concern for PE.   Pain has returned and patient given dose of additional doses of medications.  Discussed with Dr. Sheldon of general surgery given some concern for acalculous cholecystitis.  He agrees with CT scan and possibly HIDA scan if pain persists.  He feels if her pain can be well-controlled she can follow-up as an outpatient but if she has persistent pain he would recommend HIDA scan today. CT to be obtained to also evaluate for alternative diagnoses such as kidney stone  Care transferred at shift change pending CT scan and  reassessment.  May require surgical evaluation or HIDA scan if pain persists.      Final diagnoses:  None    ED Discharge Orders     None          Henrine Hayter, Garnette, MD 10/16/23 [phone]

## 2023-10-31 ENCOUNTER — Encounter (HOSPITAL_BASED_OUTPATIENT_CLINIC_OR_DEPARTMENT_OTHER): Payer: Self-pay | Admitting: Family Medicine

## 2023-10-31 ENCOUNTER — Ambulatory Visit (INDEPENDENT_AMBULATORY_CARE_PROVIDER_SITE_OTHER): Admitting: Family Medicine

## 2023-10-31 VITALS — BP 129/82 | HR 84 | Temp 98.6°F | Resp 16 | Ht 64.96 in | Wt 159.6 lb

## 2023-10-31 DIAGNOSIS — I2699 Other pulmonary embolism without acute cor pulmonale: Secondary | ICD-10-CM | POA: Diagnosis not present

## 2023-10-31 DIAGNOSIS — Z124 Encounter for screening for malignant neoplasm of cervix: Secondary | ICD-10-CM

## 2023-10-31 MED ORDER — APIXABAN 5 MG PO TABS: 5.0000 mg | ORAL_TABLET | Freq: Two times a day (BID) | ORAL | 2 refills | Status: DC

## 2023-10-31 NOTE — Assessment & Plan Note (Signed)
 As above.  Continue Eliquis  and consult Dr. Ezzard for assistance with the Coagulopathy workup.

## 2023-10-31 NOTE — Assessment & Plan Note (Signed)
 F/u with GYN as directed.

## 2023-10-31 NOTE — Progress Notes (Signed)
 New Patient Office Visit  Subjective    Patient ID: Diana Peterson, female    DOB: 1980-08-27  Age: 43 y.o. MRN: [id number]  CC:  Chief Complaint  Patient presents with   Establish Care    Establish care    Hospital f/u    HPI Diana Peterson presents to establish care F/u as above.  New to my practice.  Recently diagnosed with a RLL PE, with no history to explain why.  No clear etiology of the PE.  Denies any hormonal contraception in years.  Otherwise feels fine, and takes good care of herself.  No trips involving prolonged immobilization.  Outpatient Encounter Medications as of 10/31/2023  Medication Sig   apixaban  (ELIQUIS ) 5 MG TABS tablet Take 1 tablet (5 mg total) by mouth 2 (two) times daily.   cyclobenzaprine  (FLEXERIL ) 10 MG tablet Take 1 tablet (10 mg total) by mouth 2 (two) times daily as needed for muscle spasms.   MULTIPLE VITAMIN PO Take by mouth.   oxyCODONE -acetaminophen  (PERCOCET/ROXICET) 5-325 MG tablet Take 1 tablet by mouth every 6 (six) hours as needed for severe pain (pain score 7-10).   triamcinolone  cream (KENALOG ) 0.1 % Apply 1 Application topically 2 (two) times daily. To affected area till better   [DISCONTINUED] APIXABAN  (ELIQUIS ) VTE STARTER PACK (10MG  AND 5MG ) Take as directed on package: start with two-5mg  tablets twice daily for 7 days. On day 8, switch to one-5mg  tablet twice daily.   No facility-administered encounter medications on file as of 10/31/2023.    Past Medical History:  Diagnosis Date   Asthma    Contraception management    f/by GYN   History of gestational hypertension    Hx of chlamydia infection    Pulmonary embolus, right (HCC)    2025.  Idiopathic with workup pending    Past Surgical History:  Procedure Laterality Date   COCCYX FRACTURE SURGERY     2002 and 2003   LAPAROSCOPIC BILATERAL SALPINGECTOMY Bilateral 11/17/2014   Procedure: LAPAROSCOPIC BILATERAL SALPINGECTOMY;  Surgeon: Jerolyn Foil, MD;  Location: [facility];  Service: Gynecology;  Laterality: Bilateral;    Family History  Problem Relation Age of Onset   Hyperlipidemia Father    Hypertension Father    Diabetes Paternal Aunt    Diabetes Paternal Uncle    Diabetes Paternal Grandmother    Birth defects Neg Hx    Asthma Neg Hx    Arthritis Neg Hx    Alcohol abuse Neg Hx    Cancer Neg Hx    COPD Neg Hx    Depression Neg Hx    Drug abuse Neg Hx    Early death Neg Hx    Hearing loss Neg Hx    Heart disease Neg Hx    Kidney disease Neg Hx    Learning disabilities Neg Hx    Mental illness Neg Hx    Mental retardation Neg Hx    Miscarriages / Stillbirths Neg Hx    Stroke Neg Hx    Vision loss Neg Hx    Varicose Veins Neg Hx     Social History   Tobacco Use   Smoking status: Former    Current packs/day: 0.00    Types: Cigarettes    Quit date: 11/07/2013    Years since quitting: 9.9    Passive exposure: Past   Smokeless tobacco: Never  Vaping Use   Vaping status: Never Used  Substance Use Topics   Alcohol use: No  Drug use: No    Review of Systems  Constitutional:  Negative for diaphoresis, fever, malaise/fatigue and weight loss.  Respiratory:  Negative for cough, shortness of breath and wheezing.   Cardiovascular:  Negative for chest pain, palpitations, orthopnea, claudication, leg swelling and PND.        Objective    BP 129/82 (Cuff Size: Normal)   Pulse 84   Temp 98.6 F (37 C) (Oral)   Resp 16   Ht 5' 4.96 (1.65 m)   Wt 159 lb 9.6 oz (72.4 kg)   LMP 09/29/2023 (Approximate) Comment: negative urine pregnancy test 10/15/23  SpO2 97%   BMI 26.59 kg/m   Physical Exam Constitutional:      General: She is not in acute distress.    Appearance: Normal appearance.  HENT:     Head: Normocephalic.  Neck:     Vascular: No carotid bruit.  Cardiovascular:     Rate and Rhythm: Normal rate and regular rhythm.     Pulses: Normal pulses.     Heart sounds: Normal heart sounds.  Pulmonary:     Effort:  Pulmonary effort is normal.     Breath sounds: Normal breath sounds.  Abdominal:     General: Bowel sounds are normal.     Palpations: Abdomen is soft.  Musculoskeletal:     Cervical back: Neck supple. No tenderness.     Right lower leg: No edema.     Left lower leg: No edema.  Neurological:     Mental Status: She is alert.         Assessment & Plan:  Pulmonary embolism without acute cor pulmonale, unspecified chronicity, unspecified pulmonary embolism type [facility]) Assessment & Plan: As above.  Continue Eliquis  and consult Dr. Ezzard for assistance with the Coagulopathy workup.  Orders: -     Ambulatory referral to Hematology / Oncology -     Apixaban ; Take 1 tablet (5 mg total) by mouth 2 (two) times daily.  Dispense: 60 tablet; Refill: 2  Cervical cancer screening Assessment & Plan: F/u with GYN as directed.   I personally spent a total of 30 minutes in the care of the patient today including performing a medically appropriate exam/evaluation, counseling and educating, referring and communicating with other health care professionals, documenting clinical information in the EHR, and communicating results.   Return if symptoms worsen or fail to improve.   Diana Peterson., MD

## 2023-11-03 ENCOUNTER — Telehealth (HOSPITAL_BASED_OUTPATIENT_CLINIC_OR_DEPARTMENT_OTHER): Payer: Self-pay | Admitting: Family Medicine

## 2023-11-03 ENCOUNTER — Telehealth (HOSPITAL_BASED_OUTPATIENT_CLINIC_OR_DEPARTMENT_OTHER): Payer: Self-pay | Admitting: *Deleted

## 2023-11-03 NOTE — Telephone Encounter (Signed)
 Copied from CRM #[id number]. Topic: Clinical - Medication Question >> Nov 03, 2023 10:45 AM Ahlexyia S wrote: Reason for CRM: Pt called in wanting to know what she should do for her medication apixaban  (ELIQUIS ) 5 MG TABS tablet. Pt stated that when she went to pick up her meds and was quoted $553.90 for a months supply and pt stated she can't afford that so she wouldn't be able to receive the medication. Pt is wanting to know what she should do moving forward.

## 2023-11-04 ENCOUNTER — Telehealth (HOSPITAL_BASED_OUTPATIENT_CLINIC_OR_DEPARTMENT_OTHER): Payer: Self-pay | Admitting: *Deleted

## 2023-11-05 ENCOUNTER — Encounter (HOSPITAL_BASED_OUTPATIENT_CLINIC_OR_DEPARTMENT_OTHER): Payer: Self-pay

## 2023-11-05 NOTE — Telephone Encounter (Signed)
Pt. Was called

## 2023-11-05 NOTE — Telephone Encounter (Signed)
 Pt. Made aware.

## 2023-11-06 ENCOUNTER — Telehealth (HOSPITAL_BASED_OUTPATIENT_CLINIC_OR_DEPARTMENT_OTHER): Payer: Self-pay | Admitting: *Deleted

## 2023-11-06 NOTE — Telephone Encounter (Signed)
 Copied from CRM #[id number]. Topic: Clinical - Medication Question >> Nov 06, 2023 10:51 AM Charlet HERO wrote: Reason for CRM: Patient is calling about blood thiner med eloquis she applied for rx card and she did not get the card but offered discount she is suppose to get it mailed to her tofill out the information she would like to know if she can take one pill a day instead of 2 per day bc she only has a week worth left and she wants to know if taking the one a day is ok instead of the two since she can not afford the script until she gets the discount from the company directly.

## 2023-11-11 ENCOUNTER — Telehealth (HOSPITAL_BASED_OUTPATIENT_CLINIC_OR_DEPARTMENT_OTHER): Payer: Self-pay

## 2023-11-11 NOTE — Telephone Encounter (Signed)
 Patient came in with paperwork to be filled out for Eliquis  assistance program. Taken to Provider on 11/11/2023.

## 2023-11-16 NOTE — Progress Notes (Unsigned)
 [facility] at [facility] [address] [place],  [place]  [address] [phone]  Clinic Day:  11/17/2023  Referring physician: Dottie Norleen PHEBE PONCE, MD   HISTORY OF PRESENT ILLNESS:  The patient is a 43 y.o. female  who I was asked to consult upon for a pulmonary embolus.  The patient recalls waking up 1 morning with posterior right chest wall discomfort.  Over a span of hours, severe pressure-like pain developed, which led to her being extremely uncomfortable.  She went to the emergency room for further evaluation.  As D-dimer testing came back positive, it led to a CT angiogram being done in late August 2025, which revealed a pulmonary embolus in the posterior basal subsegmental artery of her right lower lobe.  Based upon this finding, she was placed on Eliquis , which she continues to take.  The patient denies having any chest wall trauma, leg trauma, travel history, recent surgery or other factors which could have precipitated her small pulmonary embolus.  The patient denies there being either a personal or family history of blood clots.  PAST MEDICAL HISTORY:   Past Medical History:  Diagnosis Date   Asthma    Contraception management    f/by GYN   History of gestational hypertension    Hx of chlamydia infection    Pulmonary embolus, right    2025.  Idiopathic with workup pending    PAST SURGICAL HISTORY:   Past Surgical History:  Procedure Laterality Date   COCCYX FRACTURE SURGERY     2002 and 2003   LAPAROSCOPIC BILATERAL SALPINGECTOMY Bilateral 11/17/2014   Procedure: LAPAROSCOPIC BILATERAL SALPINGECTOMY;  Surgeon: Jerolyn Foil, MD;  Location: [facility];  Service: Gynecology;  Laterality: Bilateral;    CURRENT MEDICATIONS:   Current Outpatient Medications  Medication Sig Dispense Refill   apixaban  (ELIQUIS ) 5 MG TABS tablet Take 1 tablet (5 mg total) by mouth 2 (two) times daily. 60 tablet 2   cyclobenzaprine  (FLEXERIL ) 10 MG tablet Take 1 tablet (10 mg  total) by mouth 2 (two) times daily as needed for muscle spasms. 20 tablet 0   MULTIPLE VITAMIN PO Take by mouth.     oxyCODONE -acetaminophen  (PERCOCET/ROXICET) 5-325 MG tablet Take 1 tablet by mouth every 6 (six) hours as needed for severe pain (pain score 7-10). 5 tablet 0   triamcinolone  cream (KENALOG ) 0.1 % Apply 1 Application topically 2 (two) times daily. To affected area till better 80 g 0   No current facility-administered medications for this visit.    ALLERGIES:  No Known Allergies  FAMILY HISTORY:   Family History  Problem Relation Age of Onset   Hyperlipidemia Father    Hypertension Father    Breast cancer Maternal Aunt    Breast cancer Maternal Aunt    Diabetes Paternal Uncle    Lung disease Paternal Uncle    Diabetes Paternal Grandmother    Hypertension Paternal Grandmother    Hyperlipidemia Paternal Grandmother    Stroke Paternal Grandmother    Diabetes Paternal Aunt    Other Maternal Grandfather        RARE BONE MALIGNANCY   Birth defects Neg Hx    Asthma Neg Hx    Arthritis Neg Hx    Alcohol abuse Neg Hx    Cancer Neg Hx    COPD Neg Hx    Depression Neg Hx    Drug abuse Neg Hx    Early death Neg Hx    Hearing loss Neg Hx  Heart disease Neg Hx    Kidney disease Neg Hx    Learning disabilities Neg Hx    Mental illness Neg Hx    Mental retardation Neg Hx    Miscarriages / Stillbirths Neg Hx    Vision loss Neg Hx    Varicose Veins Neg Hx     SOCIAL HISTORY:  The patient was born and raised in [place].  She currently lives in [place].  She is single, with 2 children.  She works at a [employer].  She did smoke a pack of cigarettes weekly for 17 years before quitting 10 years ago.  She drinks alcohol socially on very rare occasions.  REVIEW OF SYSTEMS:  Review of Systems  Constitutional:  Negative for fatigue and fever.  HENT:   Negative for hearing loss and sore throat.   Eyes:  Negative for eye problems.   Respiratory:  Negative for chest tightness, cough and hemoptysis.   Cardiovascular:  Negative for chest pain and palpitations.  Gastrointestinal:  Negative for abdominal distention, abdominal pain, blood in stool, constipation, diarrhea, nausea and vomiting.  Endocrine: Negative for hot flashes.  Genitourinary:  Negative for difficulty urinating, dysuria, frequency, hematuria and nocturia.   Musculoskeletal:  Negative for arthralgias, back pain, gait problem and myalgias.  Skin:  Positive for rash and wound. Negative for itching.  Neurological: Negative.  Negative for dizziness, extremity weakness, gait problem, headaches, light-headedness and numbness.  Hematological: Negative.   Psychiatric/Behavioral: Negative.  Negative for depression and suicidal ideas. The patient is not nervous/anxious.      PHYSICAL EXAM:  Blood pressure 99/72, pulse 81, temperature 99.3 F (37.4 C), temperature source Oral, resp. rate 16, height 5' 4.96 (1.65 m), weight 160 lb 8 oz (72.8 kg), SpO2 100%. Wt Readings from Last 3 Encounters:  11/17/23 160 lb 8 oz (72.8 kg)  10/31/23 159 lb 9.6 oz (72.4 kg)  10/15/23 160 lb (72.6 kg)   Body mass index is 26.74 kg/m. Performance status (ECOG): 0 - Asymptomatic Physical Exam Constitutional:      Appearance: Normal appearance. She is not ill-appearing.  HENT:     Mouth/Throat:     Mouth: Mucous membranes are moist.     Pharynx: Oropharynx is clear. No oropharyngeal exudate or posterior oropharyngeal erythema.  Cardiovascular:     Rate and Rhythm: Normal rate and regular rhythm.     Heart sounds: No murmur heard.    No friction rub. No gallop.  Pulmonary:     Effort: Pulmonary effort is normal. No respiratory distress.     Breath sounds: Normal breath sounds. No wheezing, rhonchi or rales.  Abdominal:     General: Bowel sounds are normal. There is no distension.     Palpations: Abdomen is soft. There is no mass.     Tenderness: There is no abdominal  tenderness.  Musculoskeletal:        General: No swelling.     Right lower leg: No edema.     Left lower leg: No edema.  Lymphadenopathy:     Cervical: No cervical adenopathy.     Upper Body:     Right upper body: No supraclavicular or axillary adenopathy.     Left upper body: No supraclavicular or axillary adenopathy.     Lower Body: No right inguinal adenopathy. No left inguinal adenopathy.  Skin:    General: Skin is warm.     Coloration: Skin is not jaundiced.     Findings: No lesion or  rash.  Neurological:     General: No focal deficit present.     Mental Status: She is alert and oriented to person, place, and time. Mental status is at baseline.  Psychiatric:        Mood and Affect: Mood normal.        Behavior: Behavior normal.        Thought Content: Thought content normal.    LABS:      Latest Ref Rng & Units 10/16/2023    2:28 AM 10/15/2023    9:41 PM 04/30/2022    4:15 PM  CBC  WBC 4.0 - 10.5 K/uL 6.9  8.1  5.1   Hemoglobin 12.0 - 15.0 g/dL 88.9  88.4  88.3   Hematocrit 36.0 - 46.0 % 33.8  34.4  34.8   Platelets 150 - 400 K/uL 307  348  295       Latest Ref Rng & Units 10/15/2023    9:41 PM 04/30/2022    4:15 PM 01/30/2022    2:13 AM  CMP  Glucose 70 - 99 mg/dL 84  92  99   BUN 6 - 20 mg/dL 11  10  17    Creatinine 0.44 - 1.00 mg/dL 9.29  9.27  9.19   Sodium 135 - 145 mmol/L 138  142  140   Potassium 3.5 - 5.1 mmol/L 3.5  4.1  4.0   Chloride 98 - 111 mmol/L 102  103  106   CO2 22 - 32 mmol/L 23  23  24    Calcium 8.9 - 10.3 mg/dL 9.0  9.2  9.4   Total Protein 6.5 - 8.1 g/dL 7.3  7.2  7.7   Total Bilirubin 0.0 - 1.2 mg/dL 0.3  0.2  0.5   Alkaline Phos 38 - 126 U/L 70  61  51   AST 15 - 41 U/L 18  21  20    ALT 0 - 44 U/L 12  13  14     ASSESSMENT & PLAN:  A 43 y.o. female who I was asked to consult upon for a right-sided small, subsegmental pulmonary embolus.  When reviewing her history, although she does have severe hidradenitis suppurativa, there appears to  be no other plausible explanation as to why this small pulmonary embolus developed.  Based upon this, she will undergo a hypercoagulable workup, which will check for the following clotting disorders: Protein C deficiency, protein S deficiency, factor V Leiden mutation, prothrombin gene mutation, Antithrombin deficiency, and antiphospholipid syndrome.  For now, she does continue taking Eliquis  5 mg twice daily for up to 6 months.  I will see this patient back in 3 weeks to go over the results of her hypercoagulable workup, which will determine if her anticoagulation may need to be extended indefinitely. The patient understands all the plans discussed today and is in agreement with them.  I do appreciate Dottie Norleen PHEBE PONCE, MD for his new consult.   Jacquise Rarick DELENA Kerns, MD

## 2023-11-17 ENCOUNTER — Encounter: Payer: Self-pay | Admitting: Oncology

## 2023-11-17 ENCOUNTER — Inpatient Hospital Stay: Attending: Oncology | Admitting: Oncology

## 2023-11-17 ENCOUNTER — Other Ambulatory Visit: Payer: Self-pay | Admitting: Oncology

## 2023-11-17 VITALS — BP 99/72 | HR 81 | Temp 99.3°F | Resp 16 | Ht 64.96 in | Wt 160.5 lb

## 2023-11-17 DIAGNOSIS — Z79899 Other long term (current) drug therapy: Secondary | ICD-10-CM | POA: Diagnosis not present

## 2023-11-17 DIAGNOSIS — Z9079 Acquired absence of other genital organ(s): Secondary | ICD-10-CM | POA: Insufficient documentation

## 2023-11-17 DIAGNOSIS — Z803 Family history of malignant neoplasm of breast: Secondary | ICD-10-CM | POA: Diagnosis not present

## 2023-11-17 DIAGNOSIS — I2693 Single subsegmental pulmonary embolism without acute cor pulmonale: Secondary | ICD-10-CM | POA: Diagnosis not present

## 2023-11-17 DIAGNOSIS — Z7901 Long term (current) use of anticoagulants: Secondary | ICD-10-CM | POA: Diagnosis not present

## 2023-11-17 DIAGNOSIS — Z8619 Personal history of other infectious and parasitic diseases: Secondary | ICD-10-CM | POA: Diagnosis not present

## 2023-11-17 DIAGNOSIS — Z86711 Personal history of pulmonary embolism: Secondary | ICD-10-CM | POA: Diagnosis not present

## 2023-11-17 DIAGNOSIS — I2609 Other pulmonary embolism with acute cor pulmonale: Secondary | ICD-10-CM

## 2023-11-17 DIAGNOSIS — R0789 Other chest pain: Secondary | ICD-10-CM | POA: Insufficient documentation

## 2023-11-17 DIAGNOSIS — Z823 Family history of stroke: Secondary | ICD-10-CM | POA: Insufficient documentation

## 2023-11-17 DIAGNOSIS — Z833 Family history of diabetes mellitus: Secondary | ICD-10-CM | POA: Diagnosis not present

## 2023-11-17 DIAGNOSIS — I2699 Other pulmonary embolism without acute cor pulmonale: Secondary | ICD-10-CM

## 2023-11-17 DIAGNOSIS — Z87891 Personal history of nicotine dependence: Secondary | ICD-10-CM | POA: Diagnosis not present

## 2023-11-17 DIAGNOSIS — Z83438 Family history of other disorder of lipoprotein metabolism and other lipidemia: Secondary | ICD-10-CM | POA: Diagnosis not present

## 2023-11-17 DIAGNOSIS — Z8759 Personal history of other complications of pregnancy, childbirth and the puerperium: Secondary | ICD-10-CM | POA: Diagnosis not present

## 2023-11-17 DIAGNOSIS — R21 Rash and other nonspecific skin eruption: Secondary | ICD-10-CM | POA: Diagnosis not present

## 2023-11-17 DIAGNOSIS — Z8249 Family history of ischemic heart disease and other diseases of the circulatory system: Secondary | ICD-10-CM | POA: Diagnosis not present

## 2023-11-18 ENCOUNTER — Telehealth: Payer: Self-pay | Admitting: Family Medicine

## 2023-11-18 NOTE — Telephone Encounter (Signed)
 I asked Denesha to return my call if she would like to schedule a lab appointment here at [facility]. Per a secure chat request received today 11/17/23 from [facility].

## 2023-11-18 NOTE — Telephone Encounter (Signed)
 I lvm with Araseli attempting to schedule her for a lab appointment per a secure chat message received. I asked Emmery to return my call to schedule.

## 2023-11-19 ENCOUNTER — Telehealth: Payer: Self-pay | Admitting: Oncology

## 2023-11-19 NOTE — Telephone Encounter (Signed)
 Scheduled appointment per 9/30 secure chat. Talked with the patient and she is aware of the made appointment.

## 2023-11-24 ENCOUNTER — Inpatient Hospital Stay: Attending: Oncology

## 2023-11-24 DIAGNOSIS — Z7901 Long term (current) use of anticoagulants: Secondary | ICD-10-CM | POA: Insufficient documentation

## 2023-11-24 DIAGNOSIS — Z79899 Other long term (current) drug therapy: Secondary | ICD-10-CM | POA: Diagnosis not present

## 2023-11-24 DIAGNOSIS — I2609 Other pulmonary embolism with acute cor pulmonale: Secondary | ICD-10-CM

## 2023-11-24 DIAGNOSIS — I2693 Single subsegmental pulmonary embolism without acute cor pulmonale: Secondary | ICD-10-CM | POA: Diagnosis not present

## 2023-11-24 LAB — CBC WITH DIFFERENTIAL (CANCER CENTER ONLY)
Abs Immature Granulocytes: 0.01 K/uL (ref 0.00–0.07)
Basophils Absolute: 0 K/uL (ref 0.0–0.1)
Basophils Relative: 0 %
Eosinophils Absolute: 0.1 K/uL (ref 0.0–0.5)
Eosinophils Relative: 1 %
HCT: 33.3 % — ABNORMAL LOW (ref 36.0–46.0)
Hemoglobin: 11.3 g/dL — ABNORMAL LOW (ref 12.0–15.0)
Immature Granulocytes: 0 %
Lymphocytes Relative: 40 %
Lymphs Abs: 1.8 K/uL (ref 0.7–4.0)
MCH: 30.3 pg (ref 26.0–34.0)
MCHC: 33.9 g/dL (ref 30.0–36.0)
MCV: 89.3 fL (ref 80.0–100.0)
Monocytes Absolute: 0.5 K/uL (ref 0.1–1.0)
Monocytes Relative: 11 %
Neutro Abs: 2.2 K/uL (ref 1.7–7.7)
Neutrophils Relative %: 48 %
Platelet Count: 328 K/uL (ref 150–400)
RBC: 3.73 MIL/uL — ABNORMAL LOW (ref 3.87–5.11)
RDW: 13 % (ref 11.5–15.5)
WBC Count: 4.6 K/uL (ref 4.0–10.5)
nRBC: 0 % (ref 0.0–0.2)

## 2023-11-26 LAB — LUPUS ANTICOAGULANT
DRVVT: 43.1 s (ref 0.0–47.0)
PTT Lupus Anticoagulant: 38 s (ref 0.0–43.5)
Thrombin Time: 19.6 s (ref 0.0–23.0)
dPT Confirm Ratio: 1.13 ratio (ref 0.00–1.34)
dPT: 34.6 s (ref 0.0–47.6)

## 2023-11-26 LAB — BETA-2-GLYCOPROTEIN I ABS, IGG/M/A
Beta-2 Glyco I IgG: <9 GPI IgG units (ref 0–20)
Beta-2-Glycoprotein I IgA: <9 GPI IgA units (ref 0–25)
Beta-2-Glycoprotein I IgM: <9 GPI IgM units (ref 0–32)

## 2023-11-26 LAB — ANTITHROMBIN PANEL
AT III AG PPP IMM-ACNC: 85 % (ref 72–124)
Antithrombin Activity: 125 % (ref 75–135)

## 2023-11-26 LAB — CARDIOLIPIN ANTIBODIES, IGG, IGM, IGA
Anticardiolipin IgA: <9 U/mL (ref 0–11)
Anticardiolipin IgG: <9 GPL U/mL (ref 0–14)
Anticardiolipin IgM: <9 [MPL'U]/mL (ref 0–12)

## 2023-11-26 LAB — PROTEIN C ACTIVITY: Protein C Activity: 90 % (ref 73–180)

## 2023-11-27 LAB — FACTOR 5 LEIDEN

## 2023-11-28 LAB — PROTHROMBIN GENE MUTATION

## 2023-11-28 LAB — PROTEIN C, TOTAL: Protein C, Total: 103 % (ref 60–150)

## 2023-12-07 NOTE — Progress Notes (Unsigned)
 [facility] at [facility] [address] [place],  [place]  [address] [phone]  Clinic Day:  12/08/2023  Referring physician: Dottie Norleen PHEBE PONCE, MD  TELEPHONE VISIT  HISTORY OF PRESENT ILLNESS:  The patient is a 43 y.o. female  who I recently began seeing after she developed a pulmonary embolus in the posterior basal subsegmental artery of her right lower lobe.  As it was unprovoked in nature, the patient recently underwent a hypercoagulable workup to determine if she has an clotting disorder.  This telephone visit today was to go over her labs to determine if this is the case.  Since her last visit, the patient has been doing well.  She remains compliant with her Eliquis .  She denies having any new symptoms/findings which concern her for having developed another pulmonary embolus over time.    PHYSICAL EXAM: DEFERRED   LABS:      Latest Ref Rng & Units 11/24/2023    3:40 PM 10/16/2023    2:28 AM 10/15/2023    9:41 PM  CBC  WBC 4.0 - 10.5 K/uL 4.6  6.9  8.1   Hemoglobin 12.0 - 15.0 g/dL 88.6  88.9  88.4   Hematocrit 36.0 - 46.0 % 33.3  33.8  34.4   Platelets 150 - 400 K/uL 328  307  348       Latest Ref Rng & Units 10/15/2023    9:41 PM 04/30/2022    4:15 PM 01/30/2022    2:13 AM  CMP  Glucose 70 - 99 mg/dL 84  92  99   BUN 6 - 20 mg/dL 11  10  17    Creatinine 0.44 - 1.00 mg/dL 9.29  9.27  9.19   Sodium 135 - 145 mmol/L 138  142  140   Potassium 3.5 - 5.1 mmol/L 3.5  4.1  4.0   Chloride 98 - 111 mmol/L 102  103  106   CO2 22 - 32 mmol/L 23  23  24    Calcium 8.9 - 10.3 mg/dL 9.0  9.2  9.4   Total Protein 6.5 - 8.1 g/dL 7.3  7.2  7.7   Total Bilirubin 0.0 - 1.2 mg/dL 0.3  0.2  0.5   Alkaline Phos 38 - 126 U/L 70  61  51   AST 15 - 41 U/L 18  21  20    ALT 0 - 44 U/L 12  13  14      Latest Reference Range & Units 11/24/23 15:40  Anticardiolipin Ab,IgA,Qn 0 - 11 APL U/mL <9  Anticardiolipin Ab,IgG,Qn 0 - 14 GPL U/mL <9  Anticardiolipin Ab,IgM,Qn 0 -  12 MPL U/mL <9    Latest Reference Range & Units 11/24/23 15:40  Beta-2 Glycoprotein I Ab, IgG 0 - 20 GPI IgG units <9  Beta-2-Glycoprotein I IgA 0 - 25 GPI IgA units <9  Beta-2-Glycoprotein I IgM 0 - 32 GPI IgM units <9   Lupus Anticoag Interp   Comment: (NOTE) No lupus anticoagulant was detected.    Latest Reference Range & Units 11/24/23 15:40  Antithrombin Activity 75 - 135 % 125  AT III AG PPP IMM-ACNC 72 - 124 % 85    11/24/23 15:40  Recommendations-F5LEID: Comment: Not detected  Recommendations-PTGENE: Comment: Not detected    Latest Reference Range & Units 11/24/23 15:40  Protein C-Functional 73 - 180 % 90  Protein C, Total 60 - 150 % 103   ASSESSMENT & PLAN:  A 43 y.o. female who  I recently began seeing for having a small, right-sided, subsegmental pulmonary embolus.  When evaluating her recent hypercoagulable workup, it appears that patient has been ruled out for having the following clotting disorders: Protein C deficiency, factor V Leiden mutation, prothrombin gene mutation, antithrombin deficiency, and antiphospholipid syndrome.  For whatever reason, protein S studies were not collected to determine if she is protein S deficient.  I will arrange for these studies to be done this week in [place].  I will see her back in 1 week via a telephone visit to go over her protein S studies.  If she turns out not to be protein S deficient, which is highly likely, my recommendation would be for her to continue taking her Eliquis  until she completes 6 total months of therapy.  This will be discussed with her next phone appointment in 1 week.  The patient understands all the plans discussed today and is in agreement with them.  Manette Doto DELENA Kerns, MD

## 2023-12-08 ENCOUNTER — Other Ambulatory Visit: Payer: Self-pay | Admitting: Oncology

## 2023-12-08 ENCOUNTER — Inpatient Hospital Stay: Admitting: Oncology

## 2023-12-08 DIAGNOSIS — I2609 Other pulmonary embolism with acute cor pulmonale: Secondary | ICD-10-CM

## 2023-12-08 DIAGNOSIS — I2693 Single subsegmental pulmonary embolism without acute cor pulmonale: Secondary | ICD-10-CM | POA: Diagnosis not present

## 2023-12-09 ENCOUNTER — Other Ambulatory Visit (HOSPITAL_COMMUNITY)
Admission: RE | Admit: 2023-12-09 | Discharge: 2023-12-09 | Disposition: A | Discharge status: Home / Self Care | Source: Ambulatory Visit | Attending: Family Medicine | Admitting: Family Medicine

## 2023-12-09 ENCOUNTER — Telehealth: Payer: Self-pay | Admitting: Oncology

## 2023-12-09 DIAGNOSIS — I2609 Other pulmonary embolism with acute cor pulmonale: Secondary | ICD-10-CM | POA: Diagnosis not present

## 2023-12-09 NOTE — Telephone Encounter (Signed)
 Patient has been scheduled for follow-up visit per 12/05/23 LOS.  LVM notifying pt of appt details, provided my direct number to pt if appt changes need to be made.

## 2023-12-10 LAB — PROTEIN S, TOTAL AND FREE
Protein S Ag, Free: 113 % (ref 61–136)
Protein S Ag, Total: 80 % (ref 60–150)

## 2023-12-15 NOTE — Progress Notes (Unsigned)
 [facility] at [facility] [address] [place],  [place]  [address] [phone]  Clinic Day:  12/16/2023  Referring physician: Dottie Norleen PHEBE PONCE, MD  TELEPHONE VISIT  HISTORY OF PRESENT ILLNESS:  The patient is a 43 y.o. female  who I recently began seeing after she developed a pulmonary embolus in the posterior basal subsegmental artery of her right lower lobe.  As it was unprovoked in nature, the patient recently underwent a hypercoagulable workup, whose results were negative for having protein C deficiency, factor V Leiden mutation, prothrombin gene mutation, antiphospholipid syndrome, and Antithrombin deficiency.  However, protein S studies were not done at that time to check for protein S deficiency.  As these tests were recently done, this telephone appointment today was to go over her protein S results and their implications.  Overall, the patient claims to be doing well.  She denies having any symptoms which concern her for another pulmonary embolus having developed.  Of note, she remains compliant with her Eliquis .  PHYSICAL EXAM: DEFERRED   LABS:      Latest Ref Rng & Units 11/24/2023    3:40 PM 10/16/2023    2:28 AM 10/15/2023    9:41 PM  CBC  WBC 4.0 - 10.5 K/uL 4.6  6.9  8.1   Hemoglobin 12.0 - 15.0 g/dL 88.6  88.9  88.4   Hematocrit 36.0 - 46.0 % 33.3  33.8  34.4   Platelets 150 - 400 K/uL 328  307  348       Latest Ref Rng & Units 10/15/2023    9:41 PM 04/30/2022    4:15 PM 01/30/2022    2:13 AM  CMP  Glucose 70 - 99 mg/dL 84  92  99   BUN 6 - 20 mg/dL 11  10  17    Creatinine 0.44 - 1.00 mg/dL 9.29  9.27  9.19   Sodium 135 - 145 mmol/L 138  142  140   Potassium 3.5 - 5.1 mmol/L 3.5  4.1  4.0   Chloride 98 - 111 mmol/L 102  103  106   CO2 22 - 32 mmol/L 23  23  24    Calcium 8.9 - 10.3 mg/dL 9.0  9.2  9.4   Total Protein 6.5 - 8.1 g/dL 7.3  7.2  7.7   Total Bilirubin 0.0 - 1.2 mg/dL 0.3  0.2  0.5   Alkaline Phos 38 - 126 U/L 70  61  51    AST 15 - 41 U/L 18  21  20    ALT 0 - 44 U/L 12  13  14      Latest Reference Range & Units 11/24/23 15:40  Anticardiolipin Ab,IgA,Qn 0 - 11 APL U/mL <9  Anticardiolipin Ab,IgG,Qn 0 - 14 GPL U/mL <9  Anticardiolipin Ab,IgM,Qn 0 - 12 MPL U/mL <9    Latest Reference Range & Units 11/24/23 15:40  Beta-2 Glycoprotein I Ab, IgG 0 - 20 GPI IgG units <9  Beta-2-Glycoprotein I IgA 0 - 25 GPI IgA units <9  Beta-2-Glycoprotein I IgM 0 - 32 GPI IgM units <9   Lupus Anticoag Interp   Comment: (NOTE) No lupus anticoagulant was detected.    Latest Reference Range & Units 11/24/23 15:40  Antithrombin Activity 75 - 135 % 125  AT III AG PPP IMM-ACNC 72 - 124 % 85    11/24/23 15:40  Recommendations-F5LEID: Comment: Not detected  Recommendations-PTGENE: Comment: Not detected    Latest Reference Range & Units 11/24/23  15:40  Protein C-Functional 73 - 180 % 90  Protein C, Total 60 - 150 % 103    Latest Reference Range & Units 12/09/23 16:33  Protein S, Free 61 - 136 % 113  Protein S, Total 60 - 150 % 80   ASSESSMENT & PLAN:  A 43 y.o. female who I recently began seeing for having a small, right-sided, subsegmental pulmonary embolus.  Recent labs done showed that she also tested negative for having protein S deficiency.  The patient has effectively been ruled out for having  the following clotting disorders: Protein C deficiency, protein S deficiency, factor V Leiden mutation, prothrombin gene mutation, antithrombin deficiency, and antiphospholipid syndrome.  Based upon these results and the fact that her pulmonary embolus was very small, my anticoagulation recommendations for her remain the same, which is to continue Eliquis  until she completes 6 total months of therapy.  As she has no other pressing hematologic issues, I do feel comfortable turning her care back over to her other physicians.  The patient understands all the plans discussed today and is in agreement with them.  Kee Drudge DELENA Kerns, MD

## 2023-12-16 ENCOUNTER — Inpatient Hospital Stay (HOSPITAL_BASED_OUTPATIENT_CLINIC_OR_DEPARTMENT_OTHER): Admitting: Oncology

## 2023-12-16 DIAGNOSIS — Z79899 Other long term (current) drug therapy: Secondary | ICD-10-CM | POA: Diagnosis not present

## 2023-12-16 DIAGNOSIS — I2693 Single subsegmental pulmonary embolism without acute cor pulmonale: Secondary | ICD-10-CM

## 2023-12-16 DIAGNOSIS — Z7901 Long term (current) use of anticoagulants: Secondary | ICD-10-CM | POA: Diagnosis not present

## 2023-12-21 ENCOUNTER — Emergency Department (HOSPITAL_COMMUNITY)
Admission: EM | Admit: 2023-12-21 | Discharge: 2023-12-21 | Disposition: A | Discharge status: Home / Self Care | Attending: Emergency Medicine | Admitting: Emergency Medicine

## 2023-12-21 ENCOUNTER — Other Ambulatory Visit: Payer: Self-pay

## 2023-12-21 ENCOUNTER — Emergency Department (HOSPITAL_COMMUNITY)

## 2023-12-21 DIAGNOSIS — Z87891 Personal history of nicotine dependence: Secondary | ICD-10-CM | POA: Diagnosis not present

## 2023-12-21 DIAGNOSIS — R3915 Urgency of urination: Secondary | ICD-10-CM | POA: Diagnosis not present

## 2023-12-21 DIAGNOSIS — J9811 Atelectasis: Secondary | ICD-10-CM | POA: Diagnosis not present

## 2023-12-21 DIAGNOSIS — R350 Frequency of micturition: Secondary | ICD-10-CM | POA: Insufficient documentation

## 2023-12-21 DIAGNOSIS — Z7901 Long term (current) use of anticoagulants: Secondary | ICD-10-CM | POA: Insufficient documentation

## 2023-12-21 DIAGNOSIS — R10A2 Flank pain, left side: Secondary | ICD-10-CM | POA: Insufficient documentation

## 2023-12-21 LAB — CBC WITH DIFFERENTIAL/PLATELET
Abs Immature Granulocytes: 0.01 K/uL (ref 0.00–0.07)
Basophils Absolute: 0 K/uL (ref 0.0–0.1)
Basophils Relative: 0 %
Eosinophils Absolute: 0.1 K/uL (ref 0.0–0.5)
Eosinophils Relative: 1 %
HCT: 35.4 % — ABNORMAL LOW (ref 36.0–46.0)
Hemoglobin: 11.5 g/dL — ABNORMAL LOW (ref 12.0–15.0)
Immature Granulocytes: 0 %
Lymphocytes Relative: 39 %
Lymphs Abs: 2 K/uL (ref 0.7–4.0)
MCH: 29.7 pg (ref 26.0–34.0)
MCHC: 32.5 g/dL (ref 30.0–36.0)
MCV: 91.5 fL (ref 80.0–100.0)
Monocytes Absolute: 0.4 K/uL (ref 0.1–1.0)
Monocytes Relative: 9 %
Neutro Abs: 2.5 K/uL (ref 1.7–7.7)
Neutrophils Relative %: 51 %
Platelets: 383 K/uL (ref 150–400)
RBC: 3.87 MIL/uL (ref 3.87–5.11)
RDW: 13 % (ref 11.5–15.5)
WBC: 5 K/uL (ref 4.0–10.5)
nRBC: 0 % (ref 0.0–0.2)

## 2023-12-21 LAB — COMPREHENSIVE METABOLIC PANEL WITH GFR
ALT: 14 U/L (ref 0–44)
AST: 21 U/L (ref 15–41)
Albumin: 4.2 g/dL (ref 3.5–5.0)
Alkaline Phosphatase: 72 U/L (ref 38–126)
Anion gap: 10 (ref 5–15)
BUN: 10 mg/dL (ref 6–20)
CO2: 27 mmol/L (ref 22–32)
Calcium: 9.4 mg/dL (ref 8.9–10.3)
Chloride: 100 mmol/L (ref 98–111)
Creatinine, Ser: 0.61 mg/dL (ref 0.44–1.00)
GFR, Estimated: >60 mL/min (ref >60)
Glucose, Bld: 93 mg/dL (ref 70–99)
Potassium: 4.1 mmol/L (ref 3.5–5.1)
Sodium: 137 mmol/L (ref 135–145)
Total Bilirubin: 0.4 mg/dL (ref 0.0–1.2)
Total Protein: 7.7 g/dL (ref 6.5–8.1)

## 2023-12-21 LAB — URINALYSIS, ROUTINE W REFLEX MICROSCOPIC
Bilirubin Urine: NEGATIVE
Glucose, UA: NEGATIVE mg/dL
Hgb urine dipstick: NEGATIVE
Ketones, ur: NEGATIVE mg/dL
Leukocytes,Ua: NEGATIVE
Nitrite: NEGATIVE
Protein, ur: NEGATIVE mg/dL
Specific Gravity, Urine: 1.021 (ref 1.005–1.030)
pH: 5 (ref 5.0–8.0)

## 2023-12-21 LAB — TROPONIN T, HIGH SENSITIVITY
Troponin T High Sensitivity: <15 ng/L (ref 0–19)
Troponin T High Sensitivity: <15 ng/L (ref 0–19)

## 2023-12-21 LAB — PREGNANCY, URINE: Preg Test, Ur: NEGATIVE

## 2023-12-21 MED ORDER — IOHEXOL 350 MG/ML SOLN
100.0000 mL | Freq: Once | INTRAVENOUS | Status: AC | PRN
  Administered 2023-12-21: 100 mL via INTRAVENOUS

## 2023-12-21 NOTE — ED Provider Notes (Signed)
 [place] EMERGENCY DEPARTMENT AT [facility] Provider Note   CSN: [id number] Arrival date & time: 12/21/23  1156     Patient presents with: Flank Pain   Diana Peterson is a 43 y.o. 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  The patient reports that she feels like a dull ache.  She reports that it hurts after movement but occasionally does hurt every evening as well.  She reports that she has had no missed doses of her Eliquis  however sometimes does take it later than she supposed to.  She denies any fever, nausea, vomiting, chest pain, dysuria, hematuria, abdominal pain, diarrhea, constipation, melena, hematochezia, vaginal discharge, or vaginal bleeding.  Denies any pelvic or vaginal pain.  Reports that she is been feeling some shortness of breath as well has been for the past day.  Additionally, she endorses some urinary urgency and frequency for the past few days as well.  Denies any leg swelling reports patient having some right calf cramping for the past 1 to 2 months as well.  Surgical history includes bilateral tubal ligation.  Former smoker.   Flank Pain Associated symptoms include shortness of breath. Pertinent negatives include no chest pain and no abdominal pain.       Prior to Admission medications   Medication Sig Start Date End Date Taking? Authorizing Provider  apixaban  (ELIQUIS ) 5 MG TABS tablet Take 1 tablet (5 mg total) by mouth 2 (two) times daily. 10/31/23   Dottie Norleen PHEBE PONCE, MD  cyclobenzaprine  (FLEXERIL ) 10 MG tablet Take 1 tablet (10 mg total) by mouth 2 (two) times daily as needed for muscle spasms. 10/15/23   Billy Asberry FALCON, PA-C  MULTIPLE VITAMIN PO Take by mouth.    [provider]  oxyCODONE -acetaminophen  (PERCOCET/ROXICET) 5-325 MG tablet Take 1 tablet by mouth every 6 (six) hours as needed for severe pain (pain score 7-10). 10/16/23   Mannie Pac  T, DO  triamcinolone  cream (KENALOG ) 0.1 % Apply 1 Application topically 2 (two) times daily. To affected area till better 04/30/22   Vonna Sharlet POUR, MD    Allergies: Patient has no known allergies.    Review of Systems  Constitutional:  Negative for chills and fever.  Respiratory:  Positive for shortness of breath. Negative for cough.   Cardiovascular:  Negative for chest pain.  Gastrointestinal:  Negative for abdominal pain, constipation, diarrhea, nausea and vomiting.  Genitourinary:  Positive for flank pain, frequency and urgency. Negative for dysuria, hematuria, vaginal bleeding, vaginal discharge and vaginal pain.    Updated Vital Signs BP 126/80 (BP Location: Right Arm)   Pulse 84   Temp 98.6 F (37 C) (Oral)   Resp 16   Ht 5' (1.524 m)   Wt 72.6 kg   SpO2 100%   BMI 31.25 kg/m   Physical Exam Vitals and nursing note reviewed.  Constitutional:      General: She is not in acute distress.    Appearance: She is not ill-appearing or toxic-appearing.     Comments: Watching TV in no acute distress.   Eyes:     General: No scleral icterus. Cardiovascular:     Rate and Rhythm: Normal rate.     Pulses: Normal pulses.  Pulmonary:     Effort: Pulmonary effort is normal. No respiratory distress.     Breath sounds: Normal breath sounds.  Chest:  Chest wall: No tenderness.  Abdominal:     Palpations: Abdomen is soft.     Tenderness: There is no abdominal tenderness. There is no guarding or rebound.  Musculoskeletal:        General: Tenderness present.       Back:     Right lower leg: No edema.     Left lower leg: No edema.     Comments: Legs nontender to palpation. Palpable distal pulses.  Patient has some tenderness to the marked area above going into the axilla area.  There is no overlying skin changes noted.  No swelling, induration, fluctuance, increase in warmth, or erythema.  No midline tenderness.  Skin:    General: Skin is warm and dry.  Neurological:      Mental Status: She is alert.     (all labs ordered are listed, but only abnormal results are displayed) Labs Reviewed  CBC WITH DIFFERENTIAL/PLATELET - Abnormal; Notable for the following components:      Result Value   Hemoglobin 11.5 (*)    HCT 35.4 (*)    All other components within normal limits  URINALYSIS, ROUTINE W REFLEX MICROSCOPIC  PREGNANCY, URINE  COMPREHENSIVE METABOLIC PANEL WITH GFR  TROPONIN T, HIGH SENSITIVITY    EKG: None  Radiology: No results found.  Procedures   Medications Ordered in the ED - No data to display    Medical Decision Making Amount and/or Complexity of Data Reviewed Labs: ordered. Radiology: ordered.   43 y.o. female presents to the ER for evaluation of flank pain with SOB. Differential diagnosis includes but is not limited to AAA, renal vascular thrombosis, mesenteric ischemia, pyelonephritis, nephrolithiasis, cystitis, biliary colic, pancreatitis, PUD, appendicitis, diverticulitis, bowel obstruction, ectopic. Vital signs unremarkable. Physical exam as noted above.   On previous chart evaluation, the patient was seen on 10-15-2023 and diagnosed with a PE.  At that time, she had some reproducible right upper quadrant and right flank pain.  She has been on Eliquis  since.  She reports that she has seen her hematologist and they are unsure why she had this blood clot.  She reports that she does take her Eliquis  twice a day however sometimes is late with the dosing.  Given her symptoms, concern for intrathoracic and/or intra-abdominal pathology.  I have ordered a CT PE study and CT abdomen pelvis as well as lab work.    I independently reviewed and interpreted the patient's labs. CBC shows mild anemia with hemoglobin 11.5 hematocrit 35.4, no leukocytosis.  Normal platelets.  Anemia at her baseline.  Urinalysis unremarkable.  Pregnancy test is negative.  Troponin< 15.  CMP unremarkable.  CT pending.   3:02 PM Care of SAMAMTHA TIEGS   transferred to PA Ileana Eck at the end of my shift as the patient will require reassessment once labs/imaging have resulted. Patient presentation, ED course, and plan of care discussed with review of all pertinent labs and imaging. Please see his/her note for further details regarding further ED course and disposition. Plan at time of handoff is follow up with labs and imaging. Disposition to be determined on results. This may be altered or completely changed at the discretion of the oncoming team pending results of further workup.  I discussed this case with my attending physician who cosigned this note including patient's presenting symptoms, physical exam, and planned diagnostics and interventions. Attending physician stated agreement with plan or made changes to plan which were implemented.   Portions of this report may have  been transcribed using voice recognition software. Every effort was made to ensure accuracy; however, inadvertent computerized transcription errors may be present.    Final diagnoses:  None    ED Discharge Orders     None          Bernis Ernst, [place] 12/21/23 1503    Rogelia Jerilynn RAMAN, MD 12/21/23 [phone]

## 2023-12-21 NOTE — Discharge Instructions (Signed)
 Today you were seen for left flank pain.  Your workup on the emergency department was reassuring.  I suspect this is likely due to musculoskeletal pain.  You may alternate Tylenol  and Motrin  as needed for pain.  Please follow-up with your PCP if your symptoms persist for further evaluation and workup.  Thank you for letting us  treat you today. After reviewing your labs and imaging, I feel you are safe to go home. Please follow up with your PCP in the next several days and provide them with your records from this visit. Return to the Emergency Room if pain becomes severe or symptoms worsen.

## 2023-12-21 NOTE — ED Provider Notes (Signed)
 Accepted handoff at shift change from [facility]. Please see prior provider note for more detail.   Briefly: Patient is 43 y.o. past medical history significant for unprovoked PE in August.  Patient is currently on Eliquis  presenting today for left flank pain x 4 days.  Patient does report it is worse with movement.  Patient reports compliance with Eliquis  but does take it later than she is supposed to at times.  Patient also reports some shortness of breath for the past day.  Patient reports urinary urgency and frequency.  Patient denies fever, nausea, vomiting, chest pain, hematuria, dysuria, abdominal pain, or any other complaints at this time.  DDX: concern for nephrolithiasis, pyelonephritis, cystitis, PE, diverticulitis, mesenteric ischemia, renal vascular thrombosis  Plan: Labs and imaging which will determine patient disposition   Labs which showed mild anemia at 11.5 which is chronic per historical values, CMP unremarkable, delta troponin less than 15, UA unremarkable, negative pregnancy  EKG which showed sinus rhythm  CT angio PE which showed no PE CT abdomen pelvis with contrast which showed no acute abdominal pelvic findings.  No renal stones or hydronephrosis.  7 mm hyperdense in the midline perineum at the level of the vaginal introitus, may represent a birth only in this gland cysts.  Trace bilateral pleural effusions, left greater than right.   Consider for admission or further workup however patient's vital signs, physical exam, labs, and imaging are reassuring.  Patient's symptoms likely due to musculoskeletal pain.  Patient advised to alternate Tylenol  and Motrin .  Patient to follow-up with primary care if her symptoms persist.  I feel patient safe for discharge at this time.   Francis Ileana SAILOR, PA-C 12/21/23 1715    Ula Prentice SAUNDERS, MD 12/21/23 [phone]

## 2023-12-21 NOTE — ED Triage Notes (Addendum)
 Patient c/o left flank pain x 3 days Reports otc pain relievers not working Denies n/v Pain worsens daily Pain rated 8/10 in triage Endorses urge to urinate frequently but unable produce urine  Denies injury  Patient reports being on eliquis  since August for right lung clot

## 2024-02-20 ENCOUNTER — Other Ambulatory Visit (HOSPITAL_BASED_OUTPATIENT_CLINIC_OR_DEPARTMENT_OTHER): Payer: Self-pay | Admitting: Family Medicine

## 2024-02-20 DIAGNOSIS — I2699 Other pulmonary embolism without acute cor pulmonale: Secondary | ICD-10-CM
